# Patient Record
Sex: Female | Born: 1950 | Race: White | Hispanic: No | Marital: Married | Smoking: Former smoker
Health system: Southern US, Community
[De-identification: ages and names within clinical notes are randomized; demographics above are authoritative.]

## PROBLEM LIST (undated history)

## (undated) DIAGNOSIS — J309 Allergic rhinitis, unspecified: Secondary | ICD-10-CM

## (undated) HISTORY — DX: Allergic rhinitis, unspecified: J30.9

## (undated) HISTORY — PX: TUBAL LIGATION: SHX77

---

## 2015-04-28 ENCOUNTER — Encounter: Payer: Self-pay | Admitting: Pulmonary Disease

## 2015-04-28 ENCOUNTER — Other Ambulatory Visit (INDEPENDENT_AMBULATORY_CARE_PROVIDER_SITE_OTHER): Payer: Medicaid Other

## 2015-04-28 ENCOUNTER — Ambulatory Visit (INDEPENDENT_AMBULATORY_CARE_PROVIDER_SITE_OTHER): Payer: Medicaid Other | Admitting: Pulmonary Disease

## 2015-04-28 VITALS — BP 104/70 | HR 89 | Temp 98.0°F | Ht 63.0 in | Wt 150.0 lb

## 2015-04-28 DIAGNOSIS — J9611 Chronic respiratory failure with hypoxia: Secondary | ICD-10-CM

## 2015-04-28 DIAGNOSIS — J8410 Pulmonary fibrosis, unspecified: Secondary | ICD-10-CM | POA: Diagnosis not present

## 2015-04-28 DIAGNOSIS — R0600 Dyspnea, unspecified: Secondary | ICD-10-CM | POA: Diagnosis not present

## 2015-04-28 LAB — CBC WITH DIFFERENTIAL/PLATELET
BASOS ABS: 0 10*3/uL (ref 0.0–0.1)
Basophils Relative: 0.3 % (ref 0.0–3.0)
Eosinophils Absolute: 0.2 10*3/uL (ref 0.0–0.7)
Eosinophils Relative: 2.3 % (ref 0.0–5.0)
HEMATOCRIT: 39.2 % (ref 36.0–46.0)
Hemoglobin: 13.5 g/dL (ref 12.0–15.0)
LYMPHS ABS: 1.4 10*3/uL (ref 0.7–4.0)
LYMPHS PCT: 20.2 % (ref 12.0–46.0)
MCHC: 34.5 g/dL (ref 30.0–36.0)
MCV: 94.9 fl (ref 78.0–100.0)
Monocytes Absolute: 0.5 10*3/uL (ref 0.1–1.0)
Monocytes Relative: 7.1 % (ref 3.0–12.0)
Neutro Abs: 4.9 10*3/uL (ref 1.4–7.7)
Neutrophils Relative %: 70.1 % (ref 43.0–77.0)
PLATELETS: 271 10*3/uL (ref 150.0–400.0)
RBC: 4.13 Mil/uL (ref 3.87–5.11)
RDW: 13.5 % (ref 11.5–15.5)
WBC: 6.9 10*3/uL (ref 4.0–10.5)

## 2015-04-28 LAB — BASIC METABOLIC PANEL
BUN: 8 mg/dL (ref 6–23)
CO2: 32 mEq/L (ref 19–32)
Calcium: 10.2 mg/dL (ref 8.4–10.5)
Chloride: 101 mEq/L (ref 96–112)
Creatinine, Ser: 0.81 mg/dL (ref 0.40–1.20)
GFR: 76 mL/min (ref >60.00)
Glucose, Bld: 93 mg/dL (ref 70–99)
POTASSIUM: 3.8 meq/L (ref 3.5–5.1)
Sodium: 137 mEq/L (ref 135–145)

## 2015-04-28 LAB — HEPATIC FUNCTION PANEL
ALT: 10 U/L (ref 0–35)
AST: 16 U/L (ref 0–37)
Albumin: 3.7 g/dL (ref 3.5–5.2)
Alkaline Phosphatase: 96 U/L (ref 39–117)
Bilirubin, Direct: 0.1 mg/dL (ref 0.0–0.3)
Total Bilirubin: 0.5 mg/dL (ref 0.2–1.2)
Total Protein: 7.7 g/dL (ref 6.0–8.3)

## 2015-04-28 LAB — TSH: TSH: 1.25 u[IU]/mL (ref 0.35–4.50)

## 2015-04-28 LAB — SEDIMENTATION RATE: Sed Rate: 46 mm/hr — ABNORMAL HIGH (ref 0–22)

## 2015-04-28 MED ORDER — PREDNISONE 20 MG PO TABS: 20.0000 mg | ORAL_TABLET | Freq: Every day | ORAL | Status: DC

## 2015-04-28 NOTE — Progress Notes (Addendum)
Subjective:     Patient ID: Jane Turner, female   DOB: 10/08/1951, 64 y.o.   MRN: [id number]  HPI 64 y/o female referred by Dr. Mora ApplMcLeod in [place], [place] for a pulmonary evaluation>> DrMcLeod saw [name] for the 1st time 03/11/15 w/ hx dry cough & "chronic pneumonia" per her prev physician (Dr. 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 She was [facility] an [facility] inhaler & thinks maybe that helped a little but she is off this now; used VENTOLIN-HFA w/ some improvement... DrMcLeod started her on OXYGEN at 2L/min flow based on an O2sat of 84% in his office...       She is an ex-smoker having started at age9 (around [id number]) and quit in 1990; 30 yrs of smoking mostly 1ppd but up to 2ppd when anxious; est ~40 pack-yr smoking hx...       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      FamHx includes heart dis, arthritis, but no lung diseases in family...      We have called for old records to review...        EXAM reveals Afeb, VSS, O2sat=92% on 2L/min at rest;  HEENT- neg, mallampati3;  Chest- short shallow breaths, fine velcro rales 1/2 way up w/o wheezing or consolidation;  Abd- soft, nontender;  Ext- w/o c/c/e...  [facility]  [facility]> diffuse increase in interstitial markings, normal heart size, no effusion... Note- last prev CXR was 11/2003 & it was reported clear...   [facility] 01/09/15> diffuse interstitial infiltrates in both lungs w/ periph & basilar predominance, mild bronchiectasis in RLL, 8mm noncalcif nodule in LUL, no signif adenopathy, smHH, fatty replacement of pancreas, sm cyst right kidney...  Spirometry 03/11/15 in [place]> FVC=1.25 (40%), FEV1=1.15 (48%), %1sec=93, mid-flows were recorded superphysiologic at 142% predicted; the flow-vol loop indicates poor cooperation w/ the testing procedure  Ambulatory oxygen saturation test> O2sat on 2L/min [place] at rest=97% w/ pulse=84/min;  She walked only 1 lap in our office w/ lowest O2sat=86% on her 2L oxygen & pulse was 99/min...  LABS 5/16 w/ collagen-vasc screen> Chems- wnl;  CBC- wnl;  TSH=1.25;  ACE=35;  Sed=46;  RF=neg;  p-ANCA=neg... ANA=neg;    ADDENDUM> Hi-res CTChest> done 05/06/15> +diffuse ILD w/ patchy areas of ground-glass attenuation, subpleural & peribronchovasc reticulation, and thckening of the interstitium; some assoc traction bronchiectasis (esp in LLs) and peripheral bronchiolectasis w/o frank honeycombing; no mediastinal adenopathy, norm heart size, no peric effus, smallHH, fatty atrophy of pancreas; Radiology favors fibrotic phase NSIP but the craniocaudal gradient favors UIP...   ADDENDUM> FullPFTs> pending  IMP/PLAN>> This is going to be difficult for her to work out, traveling for the  FullPFT & back for the Hi-res CTChest; ideally she would need Bronch w/ Lavage and TBBx but this is going to be difficult for her;  She has signif interstitial lung dis but the etiology/ Dx is ???at this point;  We discussed all this quite frankly & I think the best option for her might be to start PREDNSONE ~20mg /d while we attempt to gather more information, see if she has a steroid responsive pneumonitis/ ILD. (note> I spent 60 min  face to face time w/ her Hx, exam, and discussion of her diease process, and options for evaluation & treatment)    Past Medical History  Diagnosis Date  . Allergic rhinitis    ? Other PMHx >>          Ulcers >. She is taking OTC Ranitadine prn         "they are checking my left breast"         c/o back pain          Anxiety (stress w/ husb)         ?Vit B12 defic >. She is taking an OTC B12 supplement         ?Vit D defic >> she is taking an OTC VitD supplement    Past Surgical History  Procedure Laterality Date  . Tubal ligation      Patient's Medications  New Prescriptions   No medications on file  Previous Medications   ALBUTEROL (PROVENTIL HFA;VENTOLIN HFA) 108 (90 BASE) MCG/ACT INHALER    Inhale 2 puffs into the lungs every 6 (six) hours as needed for wheezing or shortness of breath.   RANITIDINE (ZANTAC) 150 MG TABLET    Take 150 mg by mouth 2 (two) times daily.  Modified Medications   No medications on file  Discontinued Medications   No medications on file   Allergies  Allergen Reactions  . Aspirin Nausea And Vomiting  . Bee Venom Swelling  . Aleve [Naproxen Sodium] Palpitations  . Azithromycin Rash  . Itraconazole Rash  . Levaquin [Levofloxacin In D5w] Rash    Family History  Problem Relation Age of Onset  . Heart disease Mother   . Heart disease Father   . Lung cancer Maternal Grandmother   . Stomach cancer Maternal Grandmother   Father died in his 13s with MI, hx arthritis, and "2-3 different kinds of cancer"... Mother died at age 103 w/ a massive MI... 4 Sibs> 2Bro- one died w/ leukemia, one still alive w/ heart disease; 2 half-sis estranged but A/W...  History   Social History  . Marital Status: Married    Spouse Name: N/A  . Number of Children: N/A  . Years of Education: N/A   Occupational History  . Not on file.   Social History Main Topics  . Smoking status: Former Smoker -- 1.00 packs/day for 30 years    Types: Cigarettes    Quit date:  01/26/1989  . Smokeless tobacco: Not on file  . Alcohol Use: Not on file  . Drug Use: Not on file  . Sexual Activity: Not on file   Other Topics Concern  . Not on file   Social History Narrative  . No narrative on file    Current Medications, Allergies, Past Medical History, Past Surgical History, Family History, and Social History were reviewed in [employer] record.   Review of Systems         All symptoms NEG except where BOLDED >>  Constitutional:  Denies F/C/S, anorexia, unexpected weight change. HEENT:  No HA, visual changes, earache, nasal symptoms, sore throat, hoarseness. Resp:  cough, sputum, hemoptysis; SOB, tightness, wheezing. Cardio:  CP, palpit, DOE, orthopnea, edema. GI:  Denies N/V/D/C or blood in stool; reflux, abd pain, distention, or gas. GU:  No dysuria, freq, urgency, hematuria, or flank pain. MS:  joint pain, swelling, tenderness, neck pain, back pain, etc. Neuro:  Headaches, tremors, seizures, dizziness, syncope, weakness, numbness, gait abn. Skin:  No suspicious lesions or skin rash. Heme:  No adenopathy, bruising, bleeding. Psyche: Denies confusion, sleep disturbance, hallucinations, anxiety, depression.   Objective:   Physical Exam    Vital Signs:  Reviewed... on O2 at 2L/min [place]...  General:  WD, WN, 64 y/o WF in NAD, chr ill appearing; alert & oriented; pleasant & cooperative... HEENT:  [place]/AT; Conjunctiva- pink, Sclera- nonicteric, EOM-wnl, PERRLA, EACs-clear, TMs-wnl; NOSE-clear; THROAT-clear & wnl. Neck:  Supple w/ fair ROM; no JVD; normal carotid impulses w/o bruits; no thyromegaly or nodules palpated; no lymphadenopathy. Chest:  Short shallow breaths, fine velcro rales 1/2 way up, no wheezing or consolidation... Heart:  Regular Rhythm; norm S1 & S2 without murmurs, rubs, or gallops detected. Abdomen:  Soft & nontender- no guarding or rebound; normal bowel sounds; no organomegaly or masses palpated. Ext:  mild arthritic  changes; no varicose veins, +venous insuffic, tr edema;  Pulses intact w/o bruits. Neuro:  CNs II-XII intact; motor testing normal; sensory testing normal; gait normal & balance OK. Derm:  No lesions noted; no rash etc. Lymph:  No cervical, supraclavicular, axillary, or inguinal adenopathy palpated.   Assessment:     IMP >>  Diffuse Interstitial Lung Disease ?etiology Chronic hypoxemic resp failure   Ex-smoker Restrictive lung physiology  PLAN >> This is going to be difficult for her to work out, traveling for the FullPFT & back for the Hi-res CTChest; ideally she would need Bronch w/ Lavage and TBBx but this is going to be difficult for her;  She has signif interstitial lung dis but the etiology/ Dx is ???at this point;  We discussed all this quite frankly & I think the best option for her might be to start PREDNSONE ~20mg /d while we attempt to gather more information, see if she has a steroid responsive pneumonitis/ ILD.     Plan:     Patient's Medications  New Prescriptions   PREDNISONE (DELTASONE) 20 MG TABLET    Take 1 tablet (20 mg total) by mouth daily.  Previous Medications   ALBUTEROL (PROVENTIL HFA;VENTOLIN HFA) 108 (90 BASE) MCG/ACT INHALER    Inhale 2 puffs into the lungs every 6 (six) hours as needed for wheezing or shortness of breath.   RANITIDINE (ZANTAC) 150 MG TABLET    Take 150 mg by mouth 2 (two) times daily.  Modified Medications   No medications on file  Discontinued Medications   No medications on file

## 2015-04-28 NOTE — Patient Instructions (Signed)
Jane Turner, it was nice meeting you today...  I am concerned about your lung health>    You appear to have significant scar tissue (fibrosis) on your lungs...    We need further evaluation AND consideration of treatment for this condition...  Today we did some blood work...    We will arrange for Full PFTs to be done (complete breathing tests)...    You also need a special CT scan called a high resolution CT Chest- we will get this arranged for you...  In the interim>    Continue the Oxygen at 2L/min flow rate...    Start the new PREDNISONE 20mg  tab- take one tab daily each AM...    You may use the OTC cough syrup- DELSYM 2 tsp twice daily as needed...    You may continue the PROAIR inhaler- 1-2 sprays every 4-6h as needed...  Let's plan a follow up visit in 1 month.Marland Kitchen..Marland Kitchen

## 2015-04-29 LAB — PAN-ANCA
Atypical p-ANCA Screen: NEGATIVE
c-ANCA Screen: NEGATIVE
p-ANCA Screen: NEGATIVE

## 2015-04-29 LAB — RHEUMATOID FACTOR

## 2015-04-29 LAB — ANA: ANA: NEGATIVE

## 2015-04-29 LAB — ANGIOTENSIN CONVERTING ENZYME: ANGIOTENSIN-CONVERTING ENZYME: 35 U/L (ref 8–52)

## 2015-05-06 ENCOUNTER — Ambulatory Visit (INDEPENDENT_AMBULATORY_CARE_PROVIDER_SITE_OTHER)
Admission: RE | Admit: 2015-05-06 | Discharge: 2015-05-06 | Disposition: A | Discharge status: Home / Self Care | Payer: Medicaid Other | Source: Ambulatory Visit | Attending: Pulmonary Disease | Admitting: Pulmonary Disease

## 2015-05-06 DIAGNOSIS — J9611 Chronic respiratory failure with hypoxia: Secondary | ICD-10-CM | POA: Diagnosis not present

## 2015-05-06 DIAGNOSIS — J8410 Pulmonary fibrosis, unspecified: Secondary | ICD-10-CM

## 2015-05-06 DIAGNOSIS — R0600 Dyspnea, unspecified: Secondary | ICD-10-CM

## 2015-06-01 ENCOUNTER — Ambulatory Visit (INDEPENDENT_AMBULATORY_CARE_PROVIDER_SITE_OTHER)
Admission: RE | Admit: 2015-06-01 | Discharge: 2015-06-01 | Disposition: A | Discharge status: Home / Self Care | Payer: Medicaid Other | Source: Ambulatory Visit | Attending: Pulmonary Disease | Admitting: Pulmonary Disease

## 2015-06-01 ENCOUNTER — Telehealth: Payer: Self-pay | Admitting: Pulmonary Disease

## 2015-06-01 ENCOUNTER — Ambulatory Visit (INDEPENDENT_AMBULATORY_CARE_PROVIDER_SITE_OTHER): Payer: Medicaid Other | Admitting: Pulmonary Disease

## 2015-06-01 ENCOUNTER — Encounter (INDEPENDENT_AMBULATORY_CARE_PROVIDER_SITE_OTHER): Payer: Medicaid Other

## 2015-06-01 ENCOUNTER — Encounter: Payer: Self-pay | Admitting: Pulmonary Disease

## 2015-06-01 VITALS — BP 104/60 | HR 87 | Temp 97.2°F | Ht 63.0 in | Wt 152.0 lb

## 2015-06-01 DIAGNOSIS — J8410 Pulmonary fibrosis, unspecified: Secondary | ICD-10-CM

## 2015-06-01 DIAGNOSIS — J9611 Chronic respiratory failure with hypoxia: Secondary | ICD-10-CM

## 2015-06-01 DIAGNOSIS — R0600 Dyspnea, unspecified: Secondary | ICD-10-CM

## 2015-06-01 LAB — PULMONARY FUNCTION TEST
FEF 25-75 PRE: 0.86 L/s
FEF2575-%PRED-PRE: 38 %
FEV1-%PRED-PRE: 35 %
FEV1-PRE: 0.88 L
FEV1FVC-%Pred-Pre: 114 %
FEV6-%Pred-Pre: 32 %
FEV6-PRE: 1 L
FEV6FVC-%Pred-Pre: 104 %
FVC-%PRED-PRE: 30 %
FVC-Pre: 1 L
PRE FEV1/FVC RATIO: 88 %
PRE FEV6/FVC RATIO: 100 %

## 2015-06-01 MED ORDER — PROMETHAZINE-CODEINE 6.25-10 MG/5ML PO SYRP: 5.0000 mL | ORAL_SOLUTION | Freq: Four times a day (QID) | ORAL | Status: DC | PRN

## 2015-06-01 MED ORDER — PREDNISONE 20 MG PO TABS: 20.0000 mg | ORAL_TABLET | Freq: Every day | ORAL | Status: DC

## 2015-06-01 NOTE — Progress Notes (Signed)
Subjective:     Patient ID: Jane Turner, female   DOB: [birth year], 64 y.o.   MRN: [id number]  HPI 64 y/o female referred by Dr. Mora Appl in [place], [place] for a pulmonary evaluation>> DrMcLeod saw Jane Turner for the 1st time 03/11/15 w/ hx dry cough & "chronic pneumonia" per her prev physician (Dr. 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 She was placed on an [facility] inhaler & thinks maybe that helped a little but she is off this now; used VENTOLIN-HFA w/ some improvement... DrMcLeod started her on OXYGEN at 2L/min flow based on an O2sat of 84% in his office...       She is an ex-smoker having started at age9 (around 62) and quit in 1990; 30 yrs of smoking mostly 1ppd but up to 2ppd when anxious; est ~40 pack-yr smoking hx...       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      FamHx includes heart dis, arthritis, but no lung diseases in family...      We have called for old records to review...  EXAM reveals Afeb, VSS, O2sat=92% on 2L/min at rest;  HEENT- neg, mallampati3;  Chest- short shallow breaths, fine velcro rales 1/2 way up w/o wheezing or consolidation;  Abd- soft, nontender;  Ext- w/o c/c/e...  CXR 12/31/14  at [facility]> diffuse increase in interstitial markings, normal heart size, no effusion... Note- last prev CXR was 11/2003 & it was reported clear...   CTChest at [facility] 01/09/15> diffuse interstitial infiltrates in both lungs w/ periph & basilar predominance, mild bronchiectasis in RLL, 8mm noncalcif nodule in LUL, no signif adenopathy, smHH, fatty replacement of pancreas, sm cyst right kidney...  Spirometry 03/11/15 in [place]> FVC=1.25 (40%), FEV1=1.15 (48%), %1sec=93, mid-flows were recorded superphysiologic at 142% predicted; the flow-vol loop indicates poor cooperation w/ the testing procedure  Ambulatory oxygen saturation test 04/28/15> O2sat on 2L/min [place] at rest=97% w/ pulse=84/min;  She walked only 1 lap in our office w/ lowest O2sat=86% on her 2L oxygen & pulse was 99/min...  LABS 5/16 w/ collagen-vasc screen> Chems- wnl;  CBC- wnl;  TSH=1.25;  ACE=35;  Sed=46;  RF=neg;  p-ANCA=neg... ANA=neg;    ADDENDUM> Hi-res CTChest> done 05/06/15> +diffuse ILD w/ patchy areas of ground-glass attenuation, subpleural & peribronchovasc reticulation, and thckening of the interstitium; some assoc traction bronchiectasis (esp in LLs) and peripheral bronchiolectasis w/o frank honeycombing; no mediastinal adenopathy, norm heart size, no peric effus, smallHH, fatty atrophy of pancreas; Radiology favors fibrotic phase NSIP but the craniocaudal gradient favors UIP...   ADDENDUM> FullPFTs> sched 06/01/15 but pt unable to perform. IMP/PLAN>> This is going to be difficult for her to work out, traveling for the  FullPFT & back for the Hi-res CTChest; ideally she would need Bronch w/ Lavage and TBBx but this is going to be difficult for her;  She has signif interstitial lung dis but the etiology/ Dx is ???at this point;  We discussed all this quite frankly & I think the best option for her might be to start PREDNSONE ~20mg /d while we attempt to gather more information, see if she has a steroid responsive pneumonitis/  ILD.  ~  June 01, 2015:  35mo ROV & Jane Turner has been on the Pred20mg /d for the last month- no real change in her dyspnea, dry cough, "chest tightness"... She is here alone today & w/o her oxygen...     ILD ?etiology, restrictive physiology, bronchiectasis, chronic hypoxemic resp failure> it appears far advanced & likely in the fibrotic phase; she needs further evaluation & lung biopsy, I have suggested [facility] but she is not sure that she wants that aggressive approach & may prefer conservative approach to eval & treatment => port O2 concentrator, oral Pred, cough control, and comfort measures (she will let us know her decision); ECOG performance status=1 at present...       We reviewed prob list, meds, xrays and labs>   CXR today revealed normal heart size, coarse interstitial markings R>L c/w pulm fibrosis, no complicating features...  PFT today was aborted- she was unable to perform the PFT> avail data showed FVC=1.00 (30%), FEV1=0.88 (35%), %1sec=88, and mid-flows reduced at 38% predicted...   IMP/PLAN>>  We spent quite a bit of time discussing her dilemma- severe ILD, likely a fibrotic phase and unlikely to be responsive to treatment; We laid out 2 options> A) a more aggressive approach w/ referral to [facility] for additional testing, lung biopsy, and treatment options; or B) a more conservative approach w/ O2, Pred trial, cough syrup, comfort measures, and local follow up; she is quite conflicted as she does not have a good support system- she describes Husb as "child-like" and no help to her, 2 sons- one handicapped & one no help at all, one sis is controlling & she doesn't know where to turn, she will consider her options and let me know; in the meanwhile- we will ask [facility]- her Resp/DME for a portable concentrator (2L/min at rest & up to 4L/min w/ exercise); continue Pred20mg /d for now; add Phen Expect w/ codeine for cough; plan ROV 6wks w/ labs...    Past Medical History  Diagnosis Date  .  Allergic rhinitis    ? Other PMHx >>          Ulcers >. She is taking OTC Ranitadine prn         "they are checking my left breast"         c/o back pain          Anxiety (stress w/ husb)         ?Vit B12 defic >. She is taking an OTC B12 supplement         ?Vit D defic >> she is taking an OTC VitD supplement    Past Surgical History  Procedure Laterality Date  . Tubal ligation      Outpatient Encounter Prescriptions as of 06/01/2015  Medication Sig  . albuterol (PROVENTIL HFA;VENTOLIN HFA) 108 (90 BASE) MCG/ACT inhaler Inhale 2 puffs into the lungs every 6 (six) hours as needed for wheezing or shortness of breath.  . predniSONE (DELTASONE) 20 MG tablet Take 1 tablet (20 mg total) by mouth daily.  .Marland Kitchen  ranitidine (ZANTAC) 150 MG tablet Take 150 mg by mouth 2 (two) times daily.  . [DISCONTINUED] predniSONE (DELTASONE) 20 MG tablet Take 1 tablet (20 mg total) by mouth daily.  . promethazine-codeine (PHENERGAN WITH CODEINE) 6.25-10 MG/5ML syrup Take 5 mLs by mouth every 6 (six) hours as needed for cough.   No facility-administered encounter medications on file as of 06/01/2015.    Allergies  Allergen Reactions  . Aspirin Nausea And Vomiting  . Bee Venom Swelling  . Aleve [Naproxen Sodium] Palpitations  . Azithromycin Rash  . Itraconazole Rash  . Levaquin [Levofloxacin In D5w] Rash    Family History  Problem Relation Age of Onset  . Heart disease Mother   . Heart disease Father   . Lung cancer Maternal Grandmother   . Stomach cancer Maternal Grandmother   Father died in his 29s with MI, hx arthritis, and "2-3 different kinds of cancer"... Mother died at age 58 w/ a massive MI... 4 Sibs> 2Bro- one died w/ leukemia, one still alive w/ heart disease; 2 half-sis estranged but A/W...  History   Social History  . Marital Status: Married    Spouse Name: N/A  . Number of Children: N/A  . Years of Education: N/A   Occupational History  . Not on file.   Social History Main Topics    . Smoking status: Former Smoker -- 1.00 packs/day for 30 years    Types: Cigarettes    Quit date: 01/26/1989  . Smokeless tobacco: Not on file  . Alcohol Use: Not on file  . Drug Use: Not on file  . Sexual Activity: Not on file   Other Topics Concern  . Not on file   Social History Narrative    Current Medications, Allergies, Past Medical History, Past Surgical History, Family History, and Social History were reviewed in [employer] record.   Review of Systems         All symptoms NEG except where BOLDED >>  Constitutional:  Denies F/C/S, anorexia, unexpected weight change. HEENT:  No HA, visual changes, earache, nasal symptoms, sore throat, hoarseness. Resp:  cough, sputum, hemoptysis; SOB, tightness, wheezing. Cardio:  CP, palpit, DOE, orthopnea, edema. GI:  Denies N/V/D/C or blood in stool; reflux, abd pain, distention, or gas. GU:  No dysuria, freq, urgency, hematuria, or flank pain. MS:  joint pain, swelling, tenderness, neck pain, back pain, etc. Neuro:  Headaches, tremors, seizures, dizziness, syncope, weakness, numbness, gait abn. Skin:  No suspicious lesions or skin rash. Heme:  No adenopathy, bruising, bleeding. Psyche: Denies confusion, sleep disturbance, hallucinations, anxiety, depression.   Objective:   Physical Exam    Vital Signs:  Reviewed... on O2 at 2L/min [place]...  General:  WD, WN, Jane Turner in NAD, chr ill appearing; alert & oriented; pleasant & cooperative; talking in full sentences... HEENT:  [place]/AT; Conjunctiva- pink, Sclera- nonicteric, EOM-wnl, PERRLA, EACs-clear, TMs-wnl; NOSE-clear; THROAT-clear & wnl. Neck:  Supple w/ fair ROM; no JVD; normal carotid impulses w/o bruits; no thyromegaly or nodules palpated; no lymphadenopathy. Chest:   fine velcro rales 1/2 way up, no wheezing or consolidation heard... Heart:  Regular Rhythm; norm S1 & S2 without murmurs, rubs, or gallops detected. Abdomen:  Soft & nontender- no guarding or  rebound; normal bowel sounds; no organomegaly or masses palpated. Ext:  mild arthritic changes; no varicose veins, +venous insuffic, tr edema;  Pulses intact w/o bruits. Neuro:  No focal neuro deficits, gait normal & balance OK. Derm:  No lesions noted;  no rash etc. Lymph:  No cervical, supraclavicular, axillary, or inguinal adenopathy palpated.   Assessment:     IMP >>  Diffuse Interstitial Lung Disease ?etiology Chronic hypoxemic resp failure   Ex-smoker Restrictive lung physiology  PLAN >>  We spent quite a bit of time discussing her dilemma- severe ILD, likely a fibrotic phase and unlikely to be responsive to treatment; We laid out 2 options> A) a more aggressive approach w/ referral to [facility] for additional testing, lung biopsy, and treatment options; or B) a more conservative approach w/ O2, Pred trial, cough syrup, comfort measures, and local follow up; she is quite conflicted as she does not have a good support system- she describes Husb as "child-like" and no help to her, 2 sons- one handicapped & one no help at all, one sis is controlling & she doesn't know where to turn, she will consider her options and let me know; in the meanwhile- we will ask [facility]- her Resp/DME for a portable concentrator (2L/min at rest & up to 4L/min w/ exercise); continue Pred20mg /d for now; add Phen Expect w/ codeine for cough; plan ROV 6wks w/ labs.     Plan:     Patient's Medications  New Prescriptions   PROMETHAZINE-CODEINE (PHENERGAN WITH CODEINE) 6.25-10 MG/5ML SYRUP    Take 5 mLs by mouth every 6 (six) hours as needed for cough.  Previous Medications   ALBUTEROL (PROVENTIL HFA;VENTOLIN HFA) 108 (90 BASE) MCG/ACT INHALER    Inhale 2 puffs into the lungs every 6 (six) hours as needed for wheezing or shortness of breath.   RANITIDINE (ZANTAC) 150 MG TABLET    Take 150 mg by mouth 2 (two) times daily.  Modified Medications   Modified Medication Previous Medication   PREDNISONE (DELTASONE) 20 MG TABLET  predniSONE (DELTASONE) 20 MG tablet      Take 1 tablet (20 mg total) by mouth daily.    Take 1 tablet (20 mg total) by mouth daily.  Discontinued Medications   No medications on file

## 2015-06-01 NOTE — Patient Instructions (Signed)
Today we updated your med list in our EPIC system...    Continue your current medications the same...  Today we reviewed your options regarding your pulmonary fibrosis>>    A) consider going to [facility] for further evaluation & aggressive treatment options regarding this lung condition.Marland Kitchen.Marland Kitchen.    B) consider a conservative approach w/ empiric Prednisone therapy, cough syrup, oxygen therapy, and comfort measures...  Today we wrote to continue the Prednisone 20mg  per day for now...  We wrote for a good cough syrup> Phen Expectorant w/ Cod> one tsp every 4-6H as needed for cough...  We will arrange for a good Portable Oxygen Concentrator to use at 2L/min when at rest, and crank it up to 4L/min when mobile & exercising...  Call for any questions or if I can be of service in any way...  Let's plan a follow up visit in 6weeks.Marland Kitchen..Marland Kitchen

## 2015-06-01 NOTE — Telephone Encounter (Signed)
LMTCB-Medicaid does not cover any cough syrup Rx's-I believe patient will need to get OTC cough syrup. Will need to check with SN though.

## 2015-06-02 ENCOUNTER — Telehealth: Payer: Self-pay | Admitting: Pulmonary Disease

## 2015-06-02 NOTE — Telephone Encounter (Signed)
Will forward to [place] to f/u on  Thanks

## 2015-06-02 NOTE — Telephone Encounter (Signed)
Pt calling again this morning a/b insurance not covering cough med, informed her that we got msg on yesterday and that we will check with SN when he gets in this morning.Caren GriffinsStanley A Dalton

## 2015-06-02 NOTE — Telephone Encounter (Signed)
ATC pt x 3. Line busy. WCB.  

## 2015-06-03 NOTE — Telephone Encounter (Signed)
atc pt, na.   Dr. Kriste BasqueNadel please advise on recs for pt's cough.  Thanks!

## 2015-06-05 MED ORDER — ACETAMINOPHEN-CODEINE 120-12 MG/5ML PO SUSP: 5.0000 mL | ORAL | Status: DC | PRN

## 2015-06-05 NOTE — Telephone Encounter (Signed)
Rachel - has this been taken care of? Please advise. 

## 2015-06-05 NOTE — Telephone Encounter (Signed)
Fleet Contras - has this been taken care of yet?  Please advise.

## 2015-06-05 NOTE — Telephone Encounter (Signed)
Form signed by SN and faxed back. Form placed in SN scan folder to go downstairs. Nothing further is needed.

## 2015-06-05 NOTE — Telephone Encounter (Signed)
Spoke with pt's pharmacist about covered cough medication. Only thing covered by her insurance is tylenol with codeine elixir. SN informed of this and rx script accordingly. Pt called and notified of the change. Pt requested that rx be mailed to her. Pt informed that mail has already ran for the office and that it will not go out until Monday. Pt is ok with this . Nothing further is needed at this time.

## 2015-07-13 ENCOUNTER — Ambulatory Visit: Payer: Medicaid Other | Admitting: Pulmonary Disease

## 2015-08-07 ENCOUNTER — Ambulatory Visit: Payer: Medicaid Other | Admitting: Pulmonary Disease

## 2015-08-21 ENCOUNTER — Encounter: Payer: Self-pay | Admitting: Pulmonary Disease

## 2015-08-21 ENCOUNTER — Ambulatory Visit (INDEPENDENT_AMBULATORY_CARE_PROVIDER_SITE_OTHER): Payer: Medicaid Other | Admitting: Pulmonary Disease

## 2015-08-21 VITALS — BP 104/70 | HR 91 | Temp 98.0°F | Wt 154.0 lb

## 2015-08-21 DIAGNOSIS — J8410 Pulmonary fibrosis, unspecified: Secondary | ICD-10-CM | POA: Diagnosis not present

## 2015-08-21 DIAGNOSIS — F411 Generalized anxiety disorder: Secondary | ICD-10-CM | POA: Insufficient documentation

## 2015-08-21 DIAGNOSIS — J9611 Chronic respiratory failure with hypoxia: Secondary | ICD-10-CM

## 2015-08-21 MED ORDER — ACETAMINOPHEN-CODEINE 120-12 MG/5ML PO SUSP: 5.0000 mL | ORAL | Status: DC | PRN

## 2015-08-21 MED ORDER — ALPRAZOLAM 0.5 MG PO TABS: ORAL_TABLET | ORAL | Status: DC

## 2015-08-21 MED ORDER — PREDNISONE 20 MG PO TABS: 20.0000 mg | ORAL_TABLET | Freq: Every day | ORAL | Status: DC

## 2015-08-21 NOTE — Progress Notes (Signed)
Subjective:     Patient ID: Jane Turner, female   DOB: [birth year], 64 y.o.   MRN: [id number]  HPI 64 y/o female referred by Dr. Mora Appl in [place], [place] for a pulmonary evaluation>> DrMcLeod saw Jane Turner for the 1st time 03/11/15 w/ hx dry cough & "chronic pneumonia" per her prev physician (Dr. 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 She was placed on an [facility] inhaler & thinks maybe that helped a little but she is off this now; used VENTOLIN-HFA w/ some improvement... DrMcLeod started her on OXYGEN at 2L/min flow based on an O2sat of 84% in his office...       She is an ex-smoker having started at age9 (around 62) and quit in 1990; 30 yrs of smoking mostly 1ppd but up to 2ppd when anxious; est ~40 pack-yr smoking hx...       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      FamHx includes heart dis, arthritis, but no lung diseases in family...      We have called for old records to review...  EXAM reveals Afeb, VSS, O2sat=92% on 2L/min at rest;  HEENT- neg, mallampati3;  Chest- short shallow breaths, fine velcro rales 1/2 way up w/o wheezing or consolidation;  Abd- soft, nontender;  Ext- w/o c/c/e...  CXR 12/31/14  at [facility]> diffuse increase in interstitial markings, normal heart size, no effusion... Note- last prev CXR was 11/2003 & it was reported clear...   CTChest at [facility] 01/09/15> diffuse interstitial infiltrates in both lungs w/ periph & basilar predominance, mild bronchiectasis in RLL, 8mm noncalcif nodule in LUL, no signif adenopathy, smHH, fatty replacement of pancreas, sm cyst right kidney...  Spirometry 03/11/15 in [place]> FVC=1.25 (40%), FEV1=1.15 (48%), %1sec=93, mid-flows were recorded superphysiologic at 142% predicted; the flow-vol loop indicates poor cooperation w/ the testing procedure  Ambulatory oxygen saturation test 04/28/15> O2sat on 2L/min [place] at rest=97% w/ pulse=84/min;  She walked only 1 lap in our office w/ lowest O2sat=86% on her 2L oxygen & pulse was 99/min...  LABS 5/16 w/ collagen-vasc screen> Chems- wnl;  CBC- wnl;  TSH=1.25;  ACE=35;  Sed=46;  RF=neg;  p-ANCA=neg... ANA=neg;    ADDENDUM> Hi-res CTChest> done 05/06/15> +diffuse ILD w/ patchy areas of ground-glass attenuation, subpleural & peribronchovasc reticulation, and thckening of the interstitium; some assoc traction bronchiectasis (esp in LLs) and peripheral bronchiolectasis w/o frank honeycombing; no mediastinal adenopathy, norm heart size, no peric effus, smallHH, fatty atrophy of pancreas; Radiology favors fibrotic phase NSIP but the craniocaudal gradient favors UIP...   ADDENDUM> FullPFTs> sched 06/01/15 but pt unable to perform. IMP/PLAN>> This is going to be difficult for her to work out, traveling for the  FullPFT & back for the Hi-res CTChest; ideally she would need Bronch w/ Lavage and TBBx but this is going to be difficult for her;  She has signif interstitial lung dis but the etiology/ Dx is ???at this point;  We discussed all this quite frankly & I think the best option for her might be to start PREDNSONE ~20mg /d while we attempt to gather more information, see if she has a steroid responsive pneumonitis/  ILD.  ~  June 01, 2015:  33mo ROV & Jane Turner has been on the Pred20mg /d for the last month- no real change in her dyspnea, dry cough, "chest tightness"... She is here alone today & w/o her oxygen...     ILD ?etiology, restrictive physiology, bronchiectasis, chronic hypoxemic resp failure> it appears far advanced & likely in the fibrotic phase; she needs further evaluation & lung biopsy, I have suggested referral to [facility] but she is not sure that she wants that aggressive approach & may prefer conservative approach to eval & treatment => port O2 concentrator, oral Pred, cough control, and comfort measures (she will let us know her decision); ECOG performance status=1 at present...       We reviewed prob list, meds, xrays and labs>   CXR today revealed normal heart size, coarse interstitial markings R>L c/w pulm fibrosis, no complicating features...  PFT today was aborted- she was unable to perform the PFT> avail data showed FVC=1.00 (30%), FEV1=0.88 (35%), %1sec=88, and mid-flows reduced at 38% predicted...                       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 and unlikely to be responsive to treatment; We laid out 2 options> A) a more aggressive approach w/ referral to [facility] for additional testing, lung biopsy, and treatment options; or B) a more conservative approach w/ O2, Pred trial, cough syrup, comfort measures, and local follow up; she is quite conflicted as she does not have a good support system- she describes Husb as "child-like" and no help to her, 2 sons- one handicapped & one no help at all, one sis is controlling & she doesn't know where to turn, she will consider her options and let me know; in the meanwhile- we will ask [facility]- her Resp/DME for a portable concentrator (2L/min at rest & up to 4L/min w/ exercise); continue Pred20mg /d for now; add Phen Expect w/  codeine for cough; plan ROV 6wks w/ labs...  ~  August 21, 2015:  2-39mo ROV & Jane Turner reports "up & down- everythings about the same"> she's been on Pred20 but ran out several wks ago 7 didn't have the $$ to refill it she says (on [place]); her breathing is about the same w/ good days and bad, recently noted incr cough as well but ran out of cough syrup too...     ILD ?etiology w/ bronchiectasis on Hi-res CT, restrictive physiology, chronic hypoxemic resp failure> We reviewed the previously discussed option re: more aggressive care thru referral to [facility] vs continued more conservative approach as we've been doing w/ Pred trial, oxygen, cough syrup, comfort measures> she clearly favors the latter option,  can't handle the transport issues/ logistics of [facility]/ "it's too much for me" and she has poor support system... EXAM reveals Afeb, VSS, O2sat=94% on 2L/min at rest;  HEENT- neg, mallampati3;  Chest- short shallow breaths, fine velcro rales 1/2 way up w/o wheezing or consolidation;  Abd- soft, nontender;  Ext- w/o c/c/e... IMP/PLAN>>  Jane Turner wants to continue on a more conservative treatment path, doesn't want lung biopsy or to consider Perfenidone treatment option;  She understands that her prognosis is poor (guarded at best);  For now she will continue PRED /d, her Home O2, refill cough syrup, and plan ROV in 56mo w/ CXR & LABS (consider repeat CTChest as well);  She is quite anxious w/ mult stressors at home & offered Alprazolam 0.5mg  tid for as needed use...    Past Medical History  Diagnosis Date  . Allergic rhinitis    ? Other PMHx >>          Ulcers >. She is taking OTC Ranitadine prn         "they are checking my left breast"         c/o back pain          Anxiety (stress w/ husb)         ?Vit B12 defic >. She is taking an OTC B12 supplement         ?Vit D defic >> she is taking an OTC VitD supplement    Past Surgical History  Procedure Laterality Date  . Tubal ligation      Outpatient  Encounter Prescriptions as of 08/21/2015  Medication Sig  . albuterol (PROVENTIL HFA;VENTOLIN HFA) 108 (90 BASE) MCG/ACT inhaler Inhale 2 puffs into the lungs every 6 (six) hours as needed for wheezing or shortness of breath.  . ranitidine (ZANTAC) 150 MG tablet Take 150 mg by mouth 2 (two) times daily.  Marland Kitchen acetaminophen-codeine 120-12 MG/5ML suspension Take 5 mLs by mouth every 4 (four) hours as needed for pain. (Patient not taking: Reported on 08/21/2015)  . predniSONE (DELTASONE) 20 MG tablet Take 1 tablet (20 mg total) by mouth daily. (Patient not taking: Reported on 08/21/2015)  . promethazine-codeine (PHENERGAN WITH CODEINE) 6.25-10 MG/5ML syrup Take 5 mLs by mouth every 6 (six) hours as needed for cough. (Patient not taking: Reported on 08/21/2015)   No facility-administered encounter medications on file as of 08/21/2015.    Allergies  Allergen Reactions  . Aspirin Nausea And Vomiting  . Bee Venom Swelling  . Aleve [Naproxen Sodium] Palpitations  . Azithromycin Rash  . Itraconazole Rash  . Levaquin [Levofloxacin In D5w] Rash    Current Medications, Allergies, Past Medical History, Past Surgical History, Family History, and Social History were reviewed in [employer] record.   Review of Systems         All symptoms NEG except where BOLDED >>  Constitutional:  Denies F/C/S, anorexia, unexpected weight change. HEENT:  No HA, visual changes, earache, nasal symptoms, sore throat, hoarseness. Resp:  cough, sputum, hemoptysis; SOB, tightness, wheezing. Cardio:  CP, palpit, DOE, orthopnea, edema. GI:  Denies N/V/D/C or blood in stool; reflux, abd pain, distention, or gas. GU:  No dysuria, freq, urgency, hematuria, or flank pain. MS:  joint pain, swelling, tenderness, neck pain, back pain, etc. Neuro:  Headaches, tremors, seizures, dizziness, syncope, weakness, numbness, gait abn. Skin:  No suspicious lesions or skin rash. Heme:  No adenopathy, bruising,  bleeding. Psyche: Denies confusion, sleep disturbance, hallucinations, anxiety, depression.   Objective:  Physical Exam    Vital Signs:  Reviewed... on O2 at 2L/min [place]...  General:  WD, WN, 64 y/o WF in NAD, chr ill appearing; alert & oriented; pleasant & cooperative; talking in full sentences... HEENT:  [place]/AT; Conjunctiva- pink, Sclera- nonicteric, EOM-wnl, PERRLA, EACs-clear, TMs-wnl; NOSE-clear; THROAT-clear & wnl. Neck:  Supple w/ fair ROM; no JVD; normal carotid impulses w/o bruits; no thyromegaly or nodules palpated; no lymphadenopathy. Chest:   fine velcro rales 1/2 way up, no wheezing or consolidation heard... Heart:  Regular Rhythm; norm S1 & S2 without murmurs, rubs, or gallops detected. Abdomen:  Soft & nontender- no guarding or rebound; normal bowel sounds; no organomegaly or masses palpated. Ext:  mild arthritic changes; no varicose veins, +venous insuffic, tr edema;  Pulses intact w/o bruits. Neuro:  No focal neuro deficits, gait normal & balance OK. Derm:  No lesions noted; no rash etc. Lymph:  No cervical, supraclavicular, axillary, or inguinal adenopathy palpated.   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 and unlikely to be responsive to treatment; We laid out 2 options> A) a more aggressive approach w/ referral to [facility] for additional testing, lung biopsy, and treatment options; or B) a more conservative approach w/ O2, Pred trial, cough syrup, comfort measures, and local follow up; she is quite conflicted as she does not have a good support system- she describes Husb as "child-like" and no help to her, 2 sons- one handicapped & one no help at all, one sis is controlling & she doesn't know where to turn, she will consider her options and let  me know; in the meanwhile- we will ask [facility]- her Resp/DME for a portable concentrator (2L/min at rest & up to 4L/min w/ exercise); continue Pred20mg /d for now; add Phen Expect w/ codeine for cough; plan ROV 6wks w/ labs. 8/16> Jane Turner wants to continue on a more conservative treatment path, doesn't want lung biopsy or to consider Perfenidone treatment option;  She understands that her prognosis is poor (guarded at best);  For now she will continue PRED 20mg /d, her Home O2, refill cough syrup, and plan ROV in 87mo w/ CXR & LABS (consider repeat CTChest as well);  She is quite anxious w/ mult stressors at home & offered Alprazolam 0.5mg  tid for as needed use.     Plan:       Patient's Medications  New Prescriptions   ALPRAZOLAM (XANAX) 0.5 MG TABLET    Take 1 tablet by mouth three times daily as directed by physician  Previous Medications   ALBUTEROL (PROVENTIL HFA;VENTOLIN HFA) 108 (90 BASE) MCG/ACT INHALER    Inhale 2 puffs into the lungs every 6 (six) hours as needed for wheezing or shortness of breath.   PROMETHAZINE-CODEINE (PHENERGAN WITH CODEINE) 6.25-10 MG/5ML SYRUP    Take 5 mLs by mouth every 6 (six) hours as needed for cough.   RANITIDINE (ZANTAC) 150 MG TABLET    Take 150 mg by mouth 2 (two) times daily.  Modified Medications   Modified Medication Previous Medication   ACETAMINOPHEN-CODEINE 120-12 MG/5ML SUSPENSION acetaminophen-codeine 120-12 MG/5ML suspension      Take 5 mLs by mouth every 4 (four) hours as needed for pain.  Take 5 mLs by mouth every 4 (four) hours as needed for pain.   PREDNISONE (DELTASONE) 20 MG TABLET predniSONE (DELTASONE) 20 MG tablet      Take 1 tablet (20 mg total) by mouth daily.    Take 1 tablet (20 mg total) by mouth daily.  Discontinued Medications   No medications on file

## 2015-08-21 NOTE — Patient Instructions (Signed)
Today we updated your med list in our EPIC system...    Continue your current medications the same...  We refilled your cough syrup and the PREDNISONE - continue one tab each AM...  We also wrote for ALPRAZOLAM 0.5mg  one tab up to 3 times daily as needed for nerves to help w/ your anxiety...  Let's plan a follow up visit in about 2 months & we will plan to recheck your CXR & blood work at that time.Marland KitchenMarland Kitchen

## 2015-10-21 ENCOUNTER — Encounter: Payer: Self-pay | Admitting: Pulmonary Disease

## 2015-10-21 ENCOUNTER — Ambulatory Visit (INDEPENDENT_AMBULATORY_CARE_PROVIDER_SITE_OTHER): Payer: Medicaid Other | Admitting: Pulmonary Disease

## 2015-10-21 VITALS — BP 104/70 | HR 86 | Temp 97.8°F | Wt 154.0 lb

## 2015-10-21 DIAGNOSIS — F411 Generalized anxiety disorder: Secondary | ICD-10-CM | POA: Diagnosis not present

## 2015-10-21 DIAGNOSIS — Z23 Encounter for immunization: Secondary | ICD-10-CM

## 2015-10-21 DIAGNOSIS — J9611 Chronic respiratory failure with hypoxia: Secondary | ICD-10-CM

## 2015-10-21 DIAGNOSIS — J8410 Pulmonary fibrosis, unspecified: Secondary | ICD-10-CM | POA: Diagnosis not present

## 2015-10-21 MED ORDER — ACETAMINOPHEN-CODEINE 120-12 MG/5ML PO SUSP: 5.0000 mL | ORAL | Status: AC | PRN

## 2015-10-21 MED ORDER — ALPRAZOLAM 0.5 MG PO TABS: ORAL_TABLET | ORAL | Status: DC

## 2015-10-21 NOTE — Patient Instructions (Signed)
Today we updated your med list in our EPIC system...     We decided to cut your PREDNISONE (20mg  tabs) dose slightly>    Take one tab one day and 1/2 tab the next on an alternating schedule every other day (1, 1/2, 1, 1/2, 1, 1/2, etc)...  We refilled your Tylenol w/ codeine cough syrup, and your Xanax nerve pill...  Today we checked a follow up CXR & blood work...    We will contact you w/ the results when available...   We also gave you the 2016 FLU vaccine today...  Call for any questions...  Let's plan a follow up visit in about 3 months, sooner if needed for problems.Marland Kitchen..Marland Kitchen

## 2016-01-21 ENCOUNTER — Encounter: Payer: Self-pay | Admitting: Pulmonary Disease

## 2016-01-21 ENCOUNTER — Ambulatory Visit (INDEPENDENT_AMBULATORY_CARE_PROVIDER_SITE_OTHER)
Admission: RE | Admit: 2016-01-21 | Discharge: 2016-01-21 | Disposition: A | Discharge status: Home / Self Care | Payer: Medicaid Other | Source: Ambulatory Visit | Attending: Pulmonary Disease | Admitting: Pulmonary Disease

## 2016-01-21 ENCOUNTER — Ambulatory Visit (INDEPENDENT_AMBULATORY_CARE_PROVIDER_SITE_OTHER): Payer: Medicaid Other | Admitting: Pulmonary Disease

## 2016-01-21 ENCOUNTER — Telehealth: Payer: Self-pay | Admitting: Internal Medicine

## 2016-01-21 VITALS — BP 128/80 | HR 93 | Temp 98.2°F | Ht 63.0 in | Wt 167.0 lb

## 2016-01-21 DIAGNOSIS — J8410 Pulmonary fibrosis, unspecified: Secondary | ICD-10-CM | POA: Diagnosis not present

## 2016-01-21 DIAGNOSIS — F411 Generalized anxiety disorder: Secondary | ICD-10-CM

## 2016-01-21 DIAGNOSIS — J9611 Chronic respiratory failure with hypoxia: Secondary | ICD-10-CM

## 2016-01-21 MED ORDER — PROMETHAZINE-CODEINE 6.25-10 MG/5ML PO SYRP: 5.0000 mL | ORAL_SOLUTION | Freq: Four times a day (QID) | ORAL | Status: DC | PRN

## 2016-01-21 MED ORDER — ALPRAZOLAM 0.5 MG PO TABS: ORAL_TABLET | ORAL | Status: DC

## 2016-01-21 NOTE — Patient Instructions (Signed)
Today we updated your med list in our EPIC system...    Continue your current medications the same...    We refilled your cough syrup and your alprazolam nerve pill (and wrote for additional refills when needed)...  Let's plan to keep the PREDNISONE  tabs at one tab one day, alternating w/ 1/2 tab the next day...  Today we did a follow up CXR...    We will contact you w/ the results when available...   Call for any questions...  Let's plan a follow up visit in 61mo, sooner if needed for problems.Marland KitchenMarland Kitchen

## 2016-01-21 NOTE — Telephone Encounter (Signed)
Can't afford meds but they all appear generic - can't identify which ones are the most unaffordable, asked her to discuss this directly with Dr Cicero Duck during office hours

## 2016-01-22 ENCOUNTER — Telehealth: Payer: Self-pay | Admitting: Pulmonary Disease

## 2016-01-22 NOTE — Telephone Encounter (Signed)
Patient calling back to speak with Dr. Kriste Basque Per MW, patient needs to discuss her medications with SN Patient concerned because she cannot afford her medications, although most of the medications she is taking are Generic (per MW)  SN - please contact patient at [phone]

## 2016-01-22 NOTE — Telephone Encounter (Signed)
I called pt to discuss her Med problem.  She has Medicaid & the prob is that she can't afford the $30 for non-medicaid covered drugs.  The only alternative that i can think of is to consider Hospice services/ coverage & she will think about it & let me know...  SMN

## 2016-01-22 NOTE — Telephone Encounter (Signed)
Duplicate call.  See TE 01/21/16

## 2016-01-22 NOTE — Telephone Encounter (Signed)
Patient needs to speak with Dr. Kriste Basque regarding medications. Per MW, patient needs to discuss with SN during office hours.  Patient can be reached at [phone]

## 2016-02-06 ENCOUNTER — Encounter: Payer: Self-pay | Admitting: Pulmonary Disease

## 2016-02-06 NOTE — Progress Notes (Signed)
Subjective:     Patient ID: Jane Turner, female   DOB: 1951-09-07, 65 y.o.   MRN: [id number]  HPI ~  Apr 28, 2015:  Initial pulmonary consult by SN>   65 y/o female referred by Dr. Mora Appl in [place], [place] for a pulmonary evaluation>> DrMcLeod saw [name] for the 1st time 03/11/15 w/ hx dry cough & "chronic pneumonia" per her prev physician (Dr. 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 She was placed on an [facility] inhaler & thinks maybe that helped a little but she is off this now; used VENTOLIN-HFA w/ some improvement... DrMcLeod started her on OXYGEN at 2L/min flow based on an O2sat of 84% in his office...       She is an ex-smoker having started at age9 (around 69) and quit in 1990; 30 yrs of smoking mostly 1ppd but up to 2ppd when anxious; est ~40 pack-yr smoking hx...       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      FamHx includes heart dis, arthritis, but no lung diseases in family...      We have called for old records to review...  EXAM reveals Afeb, VSS, O2sat=92% on 2L/min at rest;  HEENT- neg, mallampati3;  Chest- short shallow breaths, fine velcro rales 1/2 way up w/o wheezing or consolidation;   Abd- soft, nontender;  Ext- w/o c/c/e...  CXR 12/31/14 at [facility]> diffuse increase in interstitial markings, normal heart size, no effusion... Note- last prev CXR was 11/2003 & it was reported clear...   CTChest at [facility] 01/09/15> diffuse interstitial infiltrates in both lungs w/ periph & basilar predominance, mild bronchiectasis in RLL, 8mm noncalcif nodule in LUL, no signif adenopathy, smHH, fatty replacement of pancreas, sm cyst right kidney...  Spirometry 03/11/15 in [place]> FVC=1.25 (40%), FEV1=1.15 (48%), %1sec=93, mid-flows were recorded superphysiologic at 142% predicted; the flow-vol loop indicates poor cooperation w/ the testing procedure  Ambulatory oxygen saturation test 04/28/15> O2sat on 2L/min [place] at rest=97% w/ pulse=84/min;  She walked only 1 lap in our office w/ lowest O2sat=86% on her 2L oxygen & pulse was 99/min...  LABS 5/16 w/ collagen-vasc screen> Chems- wnl;  CBC- wnl;  TSH=1.25;  ACE=35;  Sed=46;  RF=neg;  p-ANCA=neg... ANA=neg;    ADDENDUM> Hi-res CTChest> done 05/06/15> +diffuse ILD w/ patchy areas of ground-glass attenuation, subpleural & peribronchovasc reticulation, and thckening of the interstitium; some assoc traction bronchiectasis (esp in LLs) and peripheral bronchiolectasis w/o frank honeycombing; no mediastinal adenopathy, norm heart size, no peric effus, smallHH, fatty atrophy of pancreas; Radiology favors fibrotic phase NSIP but the craniocaudal gradient favors UIP...   ADDENDUM> FullPFTs> sched 06/01/15 but pt unable to perform. IMP/PLAN>> This  is going to be difficult for her to work out, traveling for the FullPFT & back for the Hi-res CTChest; ideally she would need Bronch w/ Lavage and TBBx but this is going to be difficult for her;  She has signif interstitial lung dis but the etiology/ Dx is ???at this point;  We discussed all this quite frankly & I think the best option for her might be to start PREDNSONE ~20mg /d while we attempt to gather more  information, see if she has a steroid responsive pneumonitis/ ILD.  ~  June 01, 2015:  28mo ROV & Kimori has been on the Pred20mg /d for the last month- no real change in her dyspnea, dry cough, "chest tightness"... She is here alone today & w/o her oxygen...     ILD ?etiology, restrictive physiology, bronchiectasis, chronic hypoxemic resp failure> it appears far advanced & likely in the fibrotic phase; she needs further evaluation & lung biopsy, I have suggested referral to [facility] but she is not sure that she wants that aggressive approach & may prefer conservative approach to eval & treatment => port O2 concentrator, oral Pred, cough control, and comfort measures (she will let us know her decision); ECOG performance status=1 at present...       We reviewed prob list, meds, xrays and labs>   CXR today revealed normal heart size, coarse interstitial markings R>L c/w pulm fibrosis, no complicating features...  PFT today was aborted- she was unable to perform the PFT> avail data showed FVC=1.00 (30%), FEV1=0.88 (35%), %1sec=88, and mid-flows reduced at 38% predicted...                       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 and unlikely to be responsive to treatment; We laid out 2 options> A) a more aggressive approach w/ referral to [facility] for additional testing, lung biopsy, and treatment options; or B) a more conservative approach w/ O2, Pred trial, cough syrup, comfort measures, and local follow up; she is quite conflicted as she does not have a good support system- she describes Husb as "child-like" and no help to her, 2 sons- one handicapped & one no help at all, one sis is controlling & she doesn't know where to turn, she will consider her options and let me know; in the meanwhile- we will ask [facility]- her Resp/DME for a portable concentrator (2L/min at rest & up to 4L/min w/  exercise); continue Pred20mg /d for now; add Phen Expect w/ codeine for cough; plan ROV 6wks w/ labs...  ~  August 21, 2015:  2-328mo ROV & Jenissa reports "up & down- everythings about the same"> she's been on Pred20 but ran out several wks ago 7 didn't have the $$ to refill it she says (on [place]); her breathing is about the same w/ good days and bad, recently noted incr cough as well but ran out of cough syrup too...     ILD ?etiology w/ bronchiectasis on Hi-res CT, restrictive physiology, chronic hypoxemic resp failure> We reviewed the previously discussed option re: more aggressive care thru referral to [facility] vs continued more conservative approach as we've been doing w/  Pred trial, oxygen, cough syrup, comfort measures> she clearly favors the latter option, can't handle the transport issues/ logistics of [facility]/ "it's too much for me" and she has poor support system... EXAM reveals Afeb, VSS, O2sat=94% on 2L/min at rest;  HEENT- neg, mallampati3;  Chest- short shallow breaths, fine velcro rales 1/2 way up w/o wheezing or consolidation;  Abd- soft, nontender;  Ext- w/o c/c/e... IMP/PLAN>>  Chanya wants to continue on a more conservative treatment path, doesn't want lung biopsy or to consider Perfenidone treatment option;  She understands that her prognosis is poor (guarded at best);  For now she will continue PRED 20mg /d, her Home O2, refill cough syrup, and plan ROV in 36mo w/ CXR & LABS (consider repeat CTChest as well);  She is quite anxious w/ mult stressors at home & offered Alprazolam 0.5mg  tid for as needed use...  ~  October 21, 2015:  36mo ROV & Kaoru reports that she is generally stable, good days & bad, notes incr DOE w/ activituies/ exertion;  She ventilated about probems w/ her ex-husband, family issues w/ children, but notes that her current husb has been helping more as has the church community;  She remains on Home O2, Pred20/d, Tylenol w/ codeine cough syrup, Albut HFA prn, Zantac150Bid &  Xanax0.5mg Tid (ran out);  She has chosen a conservative, supportive and symptomatic approach to treatment, she has declines more aggressive evaluation- lung bx, consideration of antifibrotic therapy, med center referral for second opinion, etc- and she reiterates her decision for me today...  EXAM shows Afeb, VSS, O2sat=92% on 2L/min at rest;  HEENT- neg, mallampati3;  Chest- short shallow breaths, fine velcro rales 1/2 way up w/o wheezing or consolidation;  Abd- soft, nontender;  Ext- w/o c/c/e...  CXR 10/16> she did not go to the [facility] as requested for f/u CXR today...  LABS 10/16> she did not go to the lab for f/u blood work as requested today... IMP/PLAN>>  ILD ?etiology (NSIP vs UIP- see Hi-res CTChest), w/ restrictive physiology, chronic hypoxemic resp failure; she forgot to go to the basement for f/u CXR & Labs as requested today; we decided to try to taper the Pred to 20mg  alternating w/ 10mg  Qod; she received the 2016 Flu vaccine today.... rec ROV in 2-3 months, sooner if needed for worsening symptoms.    Past Medical History  Diagnosis Date  . Allergic rhinitis    ? Other PMHx >>          Ulcers >. She is taking OTC Ranitadine prn         "they are checking my left breast"         c/o back pain          Anxiety (stress w/ husb)         ?Vit B12 defic >. She is taking an OTC B12 supplement         ?Vit D defic >> she is taking an OTC VitD supplement    Past Surgical History  Procedure Laterality Date  . Tubal ligation      Outpatient Encounter Prescriptions as of 10/21/2015  Medication Sig  . albuterol (PROVENTIL HFA;VENTOLIN HFA) 108 (90 BASE) MCG/ACT inhaler Inhale 2 puffs into the lungs every 6 (six) hours as needed for wheezing or shortness of breath.  . predniSONE (DELTASONE) 20 MG tablet Take 1 tablet (20 mg total) by mouth daily. (Patient taking differently: Take 20 mg by mouth daily. Alternate between 20mg  and 10mg  every other day)  .  ranitidine (ZANTAC) 150 MG  tablet Take 150 mg by mouth 2 (two) times daily.  Marland Kitchen acetaminophen-codeine 120-12 MG/5ML suspension Take 5 mLs by mouth every 4 (four) hours as needed for pain. (Patient not taking: Reported on 01/21/2016)  . [DISCONTINUED] acetaminophen-codeine 120-12 MG/5ML suspension Take 5 mLs by mouth every 4 (four) hours as needed for pain. (Patient not taking: Reported on 10/21/2015)  . [DISCONTINUED] ALPRAZolam (XANAX) 0.5 MG tablet Take 1 tablet by mouth three times daily as directed by physician (Patient not taking: Reported on 10/21/2015)  . [DISCONTINUED] ALPRAZolam (XANAX) 0.5 MG tablet Take 1 tablet by mouth three times daily as directed by physician (Patient not taking: Reported on 01/21/2016)  . [DISCONTINUED] promethazine-codeine (PHENERGAN WITH CODEINE) 6.25-10 MG/5ML syrup Take 5 mLs by mouth every 6 (six) hours as needed for cough. (Patient not taking: Reported on 08/21/2015)   No facility-administered encounter medications on file as of 10/21/2015.    Allergies  Allergen Reactions  . Aspirin Nausea And Vomiting  . Bee Venom Swelling  . Aleve [Naproxen Sodium] Palpitations  . Azithromycin Rash  . Itraconazole Rash  . Levaquin [Levofloxacin In D5w] Rash    Current Medications, Allergies, Past Medical History, Past Surgical History, Family History, and Social History were reviewed in [employer] record.   Review of Systems         All symptoms NEG except where BOLDED >>  Constitutional:  Denies F/C/S, anorexia, unexpected weight change. HEENT:  No HA, visual changes, earache, nasal symptoms, sore throat, hoarseness. Resp:  cough, sputum, hemoptysis; SOB, tightness, wheezing. Cardio:  CP, palpit, DOE, orthopnea, edema. GI:  Denies N/V/D/C or blood in stool; reflux, abd pain, distention, or gas. GU:  No dysuria, freq, urgency, hematuria, or flank pain. MS:  joint pain, swelling, tenderness, neck pain, back pain, etc. Neuro:  Headaches, tremors, seizures, dizziness,  syncope, weakness, numbness, gait abn. Skin:  No suspicious lesions or skin rash. Heme:  No adenopathy, bruising, bleeding. Psyche: Denies confusion, sleep disturbance, hallucinations, anxiety, depression.   Objective:   Physical Exam    Vital Signs:  Reviewed... on O2 at 2L/min [place]...  General:  WD, WN, 65 y/o WF in NAD, chr ill appearing; alert & oriented; pleasant & cooperative; talking in full sentences... HEENT:  [place]/AT; Conjunctiva- pink, Sclera- nonicteric, EOM-wnl, PERRLA, EACs-clear, TMs-wnl; NOSE-clear; THROAT-clear & wnl. Neck:  Supple w/ fair ROM; no JVD; normal carotid impulses w/o bruits; no thyromegaly or nodules palpated; no lymphadenopathy. Chest:   fine velcro rales 1/2 way up, no wheezing or consolidation heard... Heart:  Regular Rhythm; norm S1 & S2 without murmurs, rubs, or gallops detected. Abdomen:  Soft & nontender- no guarding or rebound; normal bowel sounds; no organomegaly or masses palpated. Ext:  mild arthritic changes; no varicose veins, +venous insuffic, tr edema;  Pulses intact w/o bruits. Neuro:  No focal neuro deficits, gait normal & balance OK. Derm:  No lesions noted; no rash etc. Lymph:  No cervical, supraclavicular, axillary, or inguinal adenopathy palpated.   Assessment:      IMP >>     Diffuse Interstitial Lung Disease ?etiology=> NSIP vs UIP via Hi-res CTChest 04/2015...    Chronic hypoxemic resp failure     Restrictive lung physiology    Ex-smoker     Anxiety  PLAN >>  6/16> We spent quite a bit of time discussing her dilemma- severe ILD, likely a fibrotic phase and unlikely to be responsive to treatment; We laid out 2 options> A) a more  aggressive approach w/ referral to [facility] for additional testing, lung biopsy, and treatment options; or B) a more conservative approach w/ O2, Pred trial, cough syrup, comfort measures, and local follow up; she is quite conflicted as she does not have a good support system- she describes Husb as "child-like" and  no help to her, 2 sons- one handicapped & one no help at all, one sis is controlling & she doesn't know where to turn, she will consider her options and let me know; in the meanwhile- we will ask [facility]- her Resp/DME for a portable concentrator (2L/min at rest & up to 4L/min w/ exercise); continue Pred20mg /d for now; add Phen Expect w/ codeine for cough; plan ROV 6wks w/ labs. 8/16> Raynah wants to continue on a more conservative treatment path, doesn't want lung biopsy or to consider Perfenidone treatment option;  She understands that her prognosis is poor (guarded at best);  For now she will continue PRED /d, her Home O2, refill cough syrup, and plan ROV in 45mo;  She is quite anxious w/ mult stressors at home & offered Alprazolam 0.5mg  tid for as needed use. 10/16> Annaka reiterates her desire for a conservative/supportive treatment program; we decided to try to taper the Pred to  alternating w/  Qod; she received the 2016 Flu vaccine today; she forgot to go to the basement for her f/u CXR & Labs.... rec ROV in 2-3 months, sooner if needed for worsening symptoms.     Plan:       Patient's Medications  New Prescriptions   No medications on file  Previous Medications   ALBUTEROL (PROVENTIL HFA;VENTOLIN HFA) 108 (90 BASE) MCG/ACT INHALER    Inhale 2 puffs into the lungs every 6 (six) hours as needed for wheezing or shortness of breath.   CHOLECALCIFEROL (VITAMIN D-3) 1000 UNITS CAPS    Take 1 capsule by mouth daily.   CYANOCOBALAMIN (VITAMIN B 12 PO)    Take by mouth daily.   PREDNISONE (DELTASONE) 20 MG TABLET    Take 1 tablet (20 mg total) by mouth daily.   RANITIDINE (ZANTAC) 150 MG TABLET    Take 150 mg by mouth 2 (two) times daily.  Modified Medications   Modified Medication Previous Medication   ACETAMINOPHEN-CODEINE 120-12 MG/5ML SUSPENSION acetaminophen-codeine 120-12 MG/5ML suspension      Take 5 mLs by mouth every 4 (four) hours as needed for pain.    Take 5 mLs by mouth every 4  (four) hours as needed for pain.   ALPRAZOLAM (XANAX) 0.5 MG TABLET ALPRAZolam (XANAX) 0.5 MG tablet      Take 1 tablet by mouth three times daily as directed by physician    Take 1 tablet by mouth three times daily as directed by physician   PROMETHAZINE-CODEINE (PHENERGAN WITH CODEINE) 6.25-10 MG/5ML SYRUP promethazine-codeine (PHENERGAN WITH CODEINE) 6.25-10 MG/5ML syrup      Take 5 mLs by mouth every 6 (six) hours as needed for cough.    Take 5 mLs by mouth every 6 (six) hours as needed for cough.  Discontinued Medications   No medications on file

## 2016-02-07 ENCOUNTER — Encounter: Payer: Self-pay | Admitting: Pulmonary Disease

## 2016-02-07 NOTE — Progress Notes (Signed)
Subjective:     Patient ID: Jane Turner, female   DOB: 1951-09-07, 65 y.o.   MRN: [id number]  HPI ~  Apr 28, 2015:  Initial pulmonary consult by SN>   65 y/o female referred by Dr. Mora Appl in [place], [place] for a pulmonary evaluation>> DrMcLeod saw Jane Turner for the 1st time 03/11/15 w/ hx dry cough & "chronic pneumonia" per her prev physician (Dr. 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 She was placed on an [facility] inhaler & thinks maybe that helped a little but she is off this now; used VENTOLIN-HFA w/ some improvement... DrMcLeod started her on OXYGEN at 2L/min flow based on an O2sat of 84% in his office...       She is an ex-smoker having started at age9 (around 69) and quit in 1990; 30 yrs of smoking mostly 1ppd but up to 2ppd when anxious; est ~40 pack-yr smoking hx...       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      FamHx includes heart dis, arthritis, but no lung diseases in family...      We have called for old records to review...  EXAM reveals Afeb, VSS, O2sat=92% on 2L/min at rest;  HEENT- neg, mallampati3;  Chest- short shallow breaths, fine velcro rales 1/2 way up w/o wheezing or consolidation;   Abd- soft, nontender;  Ext- w/o c/c/e...  CXR 12/31/14 at [facility]> diffuse increase in interstitial markings, normal heart size, no effusion... Note- last prev CXR was 11/2003 & it was reported clear...   CTChest at [facility] 01/09/15> diffuse interstitial infiltrates in both lungs w/ periph & basilar predominance, mild bronchiectasis in RLL, 8mm noncalcif nodule in LUL, no signif adenopathy, smHH, fatty replacement of pancreas, sm cyst right kidney...  Spirometry 03/11/15 in [place]> FVC=1.25 (40%), FEV1=1.15 (48%), %1sec=93, mid-flows were recorded superphysiologic at 142% predicted; the flow-vol loop indicates poor cooperation w/ the testing procedure  Ambulatory oxygen saturation test 04/28/15> O2sat on 2L/min [place] at rest=97% w/ pulse=84/min;  She walked only 1 lap in our office w/ lowest O2sat=86% on her 2L oxygen & pulse was 99/min...  LABS 5/16 w/ collagen-vasc screen> Chems- wnl;  CBC- wnl;  TSH=1.25;  ACE=35;  Sed=46;  RF=neg;  p-ANCA=neg... ANA=neg;    ADDENDUM> Hi-res CTChest> done 05/06/15> +diffuse ILD w/ patchy areas of ground-glass attenuation, subpleural & peribronchovasc reticulation, and thckening of the interstitium; some assoc traction bronchiectasis (esp in LLs) and peripheral bronchiolectasis w/o frank honeycombing; no mediastinal adenopathy, norm heart size, no peric effus, smallHH, fatty atrophy of pancreas; Radiology favors fibrotic phase NSIP but the craniocaudal gradient favors UIP...   ADDENDUM> FullPFTs> sched 06/01/15 but pt unable to perform. IMP/PLAN>> This  is going to be difficult for her to work out, traveling for the FullPFT & back for the Hi-res CTChest; ideally she would need Bronch w/ Lavage and TBBx but this is going to be difficult for her;  She has signif interstitial lung dis but the etiology/ Dx is ???at this point;  We discussed all this quite frankly & I think the best option for her might be to start PREDNSONE ~20mg /d while we attempt to gather more  information, see if she has a steroid responsive pneumonitis/ ILD.  ~  June 01, 2015:  28mo ROV & Kimori has been on the Pred20mg /d for the last month- no real change in her dyspnea, dry cough, "chest tightness"... She is here alone today & w/o her oxygen...     ILD ?etiology, restrictive physiology, bronchiectasis, chronic hypoxemic resp failure> it appears far advanced & likely in the fibrotic phase; she needs further evaluation & lung biopsy, I have suggested referral to [facility] but she is not sure that she wants that aggressive approach & may prefer conservative approach to eval & treatment => port O2 concentrator, oral Pred, cough control, and comfort measures (she will let us know her decision); ECOG performance status=1 at present...       We reviewed prob list, meds, xrays and labs>   CXR today revealed normal heart size, coarse interstitial markings R>L c/w pulm fibrosis, no complicating features...  PFT today was aborted- she was unable to perform the PFT> avail data showed FVC=1.00 (30%), FEV1=0.88 (35%), %1sec=88, and mid-flows reduced at 38% predicted...                       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 and unlikely to be responsive to treatment; We laid out 2 options> A) a more aggressive approach w/ referral to [facility] for additional testing, lung biopsy, and treatment options; or B) a more conservative approach w/ O2, Pred trial, cough syrup, comfort measures, and local follow up; she is quite conflicted as she does not have a good support system- she describes Husb as "child-like" and no help to her, 2 sons- one handicapped & one no help at all, one sis is controlling & she doesn't know where to turn, she will consider her options and let me know; in the meanwhile- we will ask [facility]- her Resp/DME for a portable concentrator (2L/min at rest & up to 4L/min w/  exercise); continue Pred20mg /d for now; add Phen Expect w/ codeine for cough; plan ROV 6wks w/ labs...  ~  August 21, 2015:  2-328mo ROV & Jenissa reports "up & down- everythings about the same"> she's been on Pred20 but ran out several wks ago 7 didn't have the $$ to refill it she says (on [place]); her breathing is about the same w/ good days and bad, recently noted incr cough as well but ran out of cough syrup too...     ILD ?etiology w/ bronchiectasis on Hi-res CT, restrictive physiology, chronic hypoxemic resp failure> We reviewed the previously discussed option re: more aggressive care thru referral to [facility] vs continued more conservative approach as we've been doing w/  Pred trial, oxygen, cough syrup, comfort measures> she clearly favors the latter option, can't handle the transport issues/ logistics of [facility]/ "it's too much for me" and she has poor support system... EXAM reveals Afeb, VSS, O2sat=94% on 2L/min at rest;  HEENT- neg, mallampati3;  Chest- short shallow breaths, fine velcro rales 1/2 way up w/o wheezing or consolidation;  Abd- soft, nontender;  Ext- w/o c/c/e... IMP/PLAN>>  Merritt wants to continue on a more conservative treatment path, doesn't want lung biopsy or to consider Perfenidone treatment option;  She understands that her prognosis is poor (guarded at best);  For now she will continue PRED 20mg /d, her Home O2, refill cough syrup, and plan ROV in 68mo w/ CXR & LABS (consider repeat CTChest as well);  She is quite anxious w/ mult stressors at home & offered Alprazolam 0.5mg  tid for as needed use...  ~  October 21, 2015:  68mo ROV & Dachelle reports that she is generally stable, good days & bad, notes incr DOE w/ activituies/ exertion;  She ventilated about probems w/ her ex-husband, family issues w/ children, but notes that her current husb has been helping more as has the church community;  She remains on Home O2, Pred20/d, Tylenol w/ codeine cough syrup, Albut HFA prn, Zantac150Bid &  Xanax0.5mg Tid (ran out);  She has chosen a conservative, supportive and symptomatic approach to treatment, she has declines more aggressive evaluation- lung bx, consideration of antifibrotic therapy, med center referral for second opinion, etc- and she reiterates her decision for me today...  EXAM shows Afeb, VSS, O2sat=92% on 2L/min at rest;  HEENT- neg, mallampati3;  Chest- short shallow breaths, fine velcro rales 1/2 way up w/o wheezing or consolidation;  Abd- soft, nontender;  Ext- w/o c/c/e...  CXR 10/16> she did not go to the [facility] as requested for f/u CXR today...  LABS 10/16> she did not go to the lab for f/u blood work as requested today... IMP/PLAN>>  ILD ?etiology (NSIP vs UIP- see Hi-res CTChest), w/ restrictive physiology, chronic hypoxemic resp failure; she forgot to go to the basement for f/u CXR & Labs as requested today; we decided to try to taper the Pred to 20mg  alternating w/ 10mg  Qod; she received the 2016 Flu vaccine today.... rec ROV in 2-3 months, sooner if needed for worsening symptoms.  ~  January 21, 2016:  56mo ROV & Oliviah indicates that her breathing is about the same- no better, no worse, and she is able to perform her ADLs satisfactorily on her meds & HomeO2 (from [facility]);  Currently taking Pred20- 1tab alt w/ 1/2tab Qod, AlbutHFA, and cough syrup prn;  She notes cough on & off (good days and bad), mostly dry (min phlegm, no blood), min CP from coughing;  She also has Zantac150Bid and Xanax 0.5mg  Tid prn (refilled)...  EXAM shows Afeb, VSS, O2sat=97% on 2L/min at rest;  HEENT- neg, mallampati3;  Chest- short shallow breaths, fine velcro rales 1/2 way up w/o wheezing or consolidation;  Abd- soft, nontender;  Ext- w/o c/c/e...  CXR 01/21/16> normal heart size, diffuse increase in the interstitial markings (?sl worse?), no edema/ effusions/ etc... IMP/PLAN>>  ILD ?etiology (NSIP vs UIP- see Hi-res CTChest), restrictive physiology, chronic hypoxemic resp failure> Bonita Quin  re-confirms her desire for a conservative treatment path- refusing lung bx, refusing Perfenidone rx, refusing referral for second opinion, and she again declines my offer to discuss her disease w/ family members;  She is asked to remain as active as poss, use her O2  24/7, and continue the Pred 20-10 Qod...     Past Medical History  Diagnosis Date  . Allergic rhinitis    ? Other PMHx >>          Ulcers >. She is taking OTC Ranitadine prn         "they are checking my left breast"         c/o back pain          Anxiety (stress w/ husb)         ?Vit B12 defic >. She is taking an OTC B12 supplement         ?Vit D defic >> she is taking an OTC VitD supplement    Past Surgical History  Procedure Laterality Date  . Tubal ligation      Outpatient Encounter Prescriptions as of 01/21/2016  Medication Sig  . albuterol (PROVENTIL HFA;VENTOLIN HFA) 108 (90 BASE) MCG/ACT inhaler Inhale 2 puffs into the lungs every 6 (six) hours as needed for wheezing or shortness of breath.  . Cholecalciferol (VITAMIN D-3) 1000 units CAPS Take 1 capsule by mouth daily.  . Cyanocobalamin (VITAMIN B 12 PO) Take by mouth daily.  . predniSONE (DELTASONE) 20 MG tablet Take 1 tablet (20 mg total) by mouth daily. (Patient taking differently: Take 20 mg by mouth daily. Alternate between 20mg  and 10mg  every other day)  . ranitidine (ZANTAC) 150 MG tablet Take 150 mg by mouth 2 (two) times daily.  Marland Kitchen acetaminophen-codeine 120-12 MG/5ML suspension Take 5 mLs by mouth every 4 (four) hours as needed for pain. (Patient not taking: Reported on 01/21/2016)  . ALPRAZolam (XANAX) 0.5 MG tablet Take 1 tablet by mouth three times daily as directed by physician  . promethazine-codeine (PHENERGAN WITH CODEINE) 6.25-10 MG/5ML syrup Take 5 mLs by mouth every 6 (six) hours as needed for cough.  . [DISCONTINUED] ALPRAZolam (XANAX) 0.5 MG tablet Take 1 tablet by mouth three times daily as directed by physician (Patient not taking: Reported on  01/21/2016)  . [DISCONTINUED] promethazine-codeine (PHENERGAN WITH CODEINE) 6.25-10 MG/5ML syrup Take 5 mLs by mouth every 6 (six) hours as needed for cough. (Patient not taking: Reported on 08/21/2015)   No facility-administered encounter medications on file as of 01/21/2016.    Allergies  Allergen Reactions  . Aspirin Nausea And Vomiting  . Bee Venom Swelling  . Aleve [Naproxen Sodium] Palpitations  . Azithromycin Rash  . Itraconazole Rash  . Levaquin [Levofloxacin In D5w] Rash    Current Medications, Allergies, Past Medical History, Past Surgical History, Family History, and Social History were reviewed in [employer] record.   Review of Systems         All symptoms NEG except where BOLDED >>  Constitutional:  Denies F/C/S, anorexia, unexpected weight change. HEENT:  No HA, visual changes, earache, nasal symptoms, sore throat, hoarseness. Resp:  cough, sputum, hemoptysis; SOB, tightness, wheezing. Cardio:  CP, palpit, DOE, orthopnea, edema. GI:  Denies N/V/D/C or blood in stool; reflux, abd pain, distention, or gas. GU:  No dysuria, freq, urgency, hematuria, or flank pain. MS:  joint pain, swelling, tenderness, neck pain, back pain, etc. Neuro:  Headaches, tremors, seizures, dizziness, syncope, weakness, numbness, gait abn. Skin:  No suspicious lesions or skin rash. Heme:  No adenopathy, bruising, bleeding. Psyche: Denies confusion, sleep disturbance, hallucinations, anxiety, depression.   Objective:   Physical Exam    Vital Signs:  Reviewed... on O2 at 2L/min [place]...  General:  WD, WN, 65 y/o WF  in NAD, chr ill appearing; alert & oriented; pleasant & cooperative; talking in full sentences... HEENT:  [place]/AT; Conjunctiva- pink, Sclera- nonicteric, EOM-wnl, PERRLA, EACs-clear, TMs-wnl; NOSE-clear; THROAT-clear & wnl. Neck:  Supple w/ fair ROM; no JVD; normal carotid impulses w/o bruits; no thyromegaly or nodules palpated; no lymphadenopathy. Chest:   fine  velcro rales 1/2 way up, no wheezing or consolidation heard... Heart:  Regular Rhythm; norm S1 & S2 without murmurs, rubs, or gallops detected. Abdomen:  Soft & nontender- no guarding or rebound; normal bowel sounds; no organomegaly or masses palpated. Ext:  mild arthritic changes; no varicose veins, +venous insuffic, tr edema;  Pulses intact w/o bruits. Neuro:  No focal neuro deficits, gait normal & balance OK. Derm:  No lesions noted; no rash etc. Lymph:  No cervical, supraclavicular, axillary, or inguinal adenopathy palpated.   Assessment:      IMP >>     Diffuse Interstitial Lung Disease ?etiology=> NSIP vs UIP via Hi-res CTChest 04/2015...    Chronic hypoxemic resp failure     Restrictive lung physiology    Ex-smoker     Anxiety  PLAN >>  6/16> We spent quite a bit of time discussing her dilemma- severe ILD, likely a fibrotic phase and unlikely to be responsive to treatment; We laid out 2 options> A) a more aggressive approach w/ referral to [facility] for additional testing, lung biopsy, and treatment options; or B) a more conservative approach w/ O2, Pred trial, cough syrup, comfort measures, and local follow up; she is quite conflicted as she does not have a good support system- she describes Husb as "child-like" and no help to her, 2 sons- one handicapped & one no help at all, one sis is controlling & she doesn't know where to turn, she will consider her options and let me know; in the meanwhile- we will ask [facility]- her Resp/DME for a portable concentrator (2L/min at rest & up to 4L/min w/ exercise); continue Pred20mg /d for now; add Phen Expect w/ codeine for cough; plan ROV 6wks w/ labs. 8/16> Murdis wants to continue on a more conservative treatment path, doesn't want lung biopsy or to consider Perfenidone treatment option;  She understands that her prognosis is poor (guarded at best);  For now she will continue PRED 20mg /d, her Home O2, refill cough syrup, and plan ROV in 14mo;  She is quite  anxious w/ mult stressors at home & offered Alprazolam 0.5mg  tid for as needed use. 10/16> A[place] reiterates her desire for a conservative/supportive treatment program; we decided to try to taper the Pred to 20mg  alternating w/ 10mg  Qod; she received the 2016 Flu vaccine today. 01/21/16>  Morrie Sheldon her desire for a conservative treatment path- refusing lung bx, refusing Perfenidone rx, refusing referral for second opinion, and she again declines my offer to discuss her disease w/ family members;  She is asked to remain as active as poss, use her O2 24/7, and continue the Pred 20-10 Qod     Plan:       Patient's Medications  New Prescriptions   No medications on file  Previous Medications   ACETAMINOPHEN-CODEINE 120-12 MG/5ML SUSPENSION    Take 5 mLs by mouth every 4 (four) hours as needed for pain.   ALBUTEROL (PROVENTIL HFA;VENTOLIN HFA) 108 (90 BASE) MCG/ACT INHALER    Inhale 2 puffs into the lungs every 6 (six) hours as needed for wheezing or shortness of breath.   CHOLECALCIFEROL (VITAMIN D-3) 1000 UNITS CAPS    Take 1 capsule  by mouth daily.   CYANOCOBALAMIN (VITAMIN B 12 PO)    Take by mouth daily.   PREDNISONE (DELTASONE) 20 MG TABLET    Take 1 tablet (20 mg total) by mouth daily.   RANITIDINE (ZANTAC) 150 MG TABLET    Take 150 mg by mouth 2 (two) times daily.  Modified Medications   Modified Medication Previous Medication   ALPRAZOLAM (XANAX) 0.5 MG TABLET ALPRAZolam (XANAX) 0.5 MG tablet      Take 1 tablet by mouth three times daily as directed by physician    Take 1 tablet by mouth three times daily as directed by physician   PROMETHAZINE-CODEINE (PHENERGAN WITH CODEINE) 6.25-10 MG/5ML SYRUP promethazine-codeine (PHENERGAN WITH CODEINE) 6.25-10 MG/5ML syrup      Take 5 mLs by mouth every 6 (six) hours as needed for cough.    Take 5 mLs by mouth every 6 (six) hours as needed for cough.  Discontinued Medications   No medications on file

## 2016-04-21 ENCOUNTER — Ambulatory Visit (INDEPENDENT_AMBULATORY_CARE_PROVIDER_SITE_OTHER): Payer: Medicaid Other | Admitting: Pulmonary Disease

## 2016-04-21 ENCOUNTER — Encounter: Payer: Self-pay | Admitting: Pulmonary Disease

## 2016-04-21 VITALS — BP 100/70 | HR 109 | Temp 98.0°F | Resp 20 | Ht 63.0 in

## 2016-04-21 DIAGNOSIS — R0600 Dyspnea, unspecified: Secondary | ICD-10-CM

## 2016-04-21 DIAGNOSIS — F411 Generalized anxiety disorder: Secondary | ICD-10-CM | POA: Diagnosis not present

## 2016-04-21 DIAGNOSIS — J8410 Pulmonary fibrosis, unspecified: Secondary | ICD-10-CM | POA: Diagnosis not present

## 2016-04-21 DIAGNOSIS — J9611 Chronic respiratory failure with hypoxia: Secondary | ICD-10-CM

## 2016-04-21 MED ORDER — ALPRAZOLAM 0.5 MG PO TABS: 0.5000 mg | ORAL_TABLET | Freq: Three times a day (TID) | ORAL | Status: AC | PRN

## 2016-04-21 MED ORDER — PREDNISONE 20 MG PO TABS: 20.0000 mg | ORAL_TABLET | Freq: Every day | ORAL | Status: AC

## 2016-04-21 NOTE — Patient Instructions (Signed)
Today we updated your med list in our EPIC system...    Continue your current medications the same...  We wrote a new prescription for your PREDNISONE 20mg  tabs- take one tab each AM...  We refilled your XANAX (Alprazolam) 0.5mg  tabs- one tab up to 3 times daily for nerves...  I [facility] for help w/ your medical condition and symptom management (shortness of breath & cough)..  Call for any questions or if we can be of service in any way...  Let's plan a follow up visit in 3months.Marland Kitchen..Marland Kitchen

## 2016-04-21 NOTE — Progress Notes (Signed)
Subjective:     Patient ID: Jane Turner, female   DOB: 1951-09-07, 65 y.o.   MRN: [id number]  HPI ~  Apr 28, 2015:  Initial pulmonary consult by SN>   65 y/o female referred by Dr. Mora Appl in [place], [place] for a pulmonary evaluation>> DrMcLeod saw Jane Turner for the 1st time 03/11/15 w/ hx dry cough & "chronic pneumonia" per her prev physician (Dr. 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 She was placed on an [facility] inhaler & thinks maybe that helped a little but she is off this now; used VENTOLIN-HFA w/ some improvement... DrMcLeod started her on OXYGEN at 2L/min flow based on an O2sat of 84% in his office...       She is an ex-smoker having started at age9 (around 69) and quit in 1990; 30 yrs of smoking mostly 1ppd but up to 2ppd when anxious; est ~40 pack-yr smoking hx...       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      FamHx includes heart dis, arthritis, but no lung diseases in family...      We have called for old records to review...  EXAM reveals Afeb, VSS, O2sat=92% on 2L/min at rest;  HEENT- neg, mallampati3;  Chest- short shallow breaths, fine velcro rales 1/2 way up w/o wheezing or consolidation;   Abd- soft, nontender;  Ext- w/o c/c/e...  CXR 12/31/14 at [facility]> diffuse increase in interstitial markings, normal heart size, no effusion... Note- last prev CXR was 11/2003 & it was reported clear...   CTChest at [facility] 01/09/15> diffuse interstitial infiltrates in both lungs w/ periph & basilar predominance, mild bronchiectasis in RLL, 8mm noncalcif nodule in LUL, no signif adenopathy, smHH, fatty replacement of pancreas, sm cyst right kidney...  Spirometry 03/11/15 in [place]> FVC=1.25 (40%), FEV1=1.15 (48%), %1sec=93, mid-flows were recorded superphysiologic at 142% predicted; the flow-vol loop indicates poor cooperation w/ the testing procedure  Ambulatory oxygen saturation test 04/28/15> O2sat on 2L/min [place] at rest=97% w/ pulse=84/min;  She walked only 1 lap in our office w/ lowest O2sat=86% on her 2L oxygen & pulse was 99/min...  LABS 5/16 w/ collagen-vasc screen> Chems- wnl;  CBC- wnl;  TSH=1.25;  ACE=35;  Sed=46;  RF=neg;  p-ANCA=neg... ANA=neg;    ADDENDUM> Hi-res CTChest> done 05/06/15> +diffuse ILD w/ patchy areas of ground-glass attenuation, subpleural & peribronchovasc reticulation, and thckening of the interstitium; some assoc traction bronchiectasis (esp in LLs) and peripheral bronchiolectasis w/o frank honeycombing; no mediastinal adenopathy, norm heart size, no peric effus, smallHH, fatty atrophy of pancreas; Radiology favors fibrotic phase NSIP but the craniocaudal gradient favors UIP...   ADDENDUM> FullPFTs> sched 06/01/15 but pt unable to perform. IMP/PLAN>> This  is going to be difficult for her to work out, traveling for the FullPFT & back for the Hi-res CTChest; ideally she would need Bronch w/ Lavage and TBBx but this is going to be difficult for her;  She has signif interstitial lung dis but the etiology/ Dx is ???at this point;  We discussed all this quite frankly & I think the best option for her might be to start PREDNSONE ~20mg /d while we attempt to gather more  information, see if she has a steroid responsive pneumonitis/ ILD.  ~  June 01, 2015:  28mo ROV & Jane Turner has been on the Pred20mg /d for the last month- no real change in her dyspnea, dry cough, "chest tightness"... She is here alone today & w/o her oxygen...     ILD ?etiology, restrictive physiology, bronchiectasis, chronic hypoxemic resp failure> it appears far advanced & likely in the fibrotic phase; she needs further evaluation & lung biopsy, I have suggested referral to [facility] but she is not sure that she wants that aggressive approach & may prefer conservative approach to eval & treatment => port O2 concentrator, oral Pred, cough control, and comfort measures (she will let us know her decision); ECOG performance status=1 at present...       We reviewed prob list, meds, xrays and labs>   CXR today revealed normal heart size, coarse interstitial markings R>L c/w pulm fibrosis, no complicating features...  PFT today was aborted- she was unable to perform the PFT> avail data showed FVC=1.00 (30%), FEV1=0.88 (35%), %1sec=88, and mid-flows reduced at 38% predicted...                       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 and unlikely to be responsive to treatment; We laid out 2 options> A) a more aggressive approach w/ referral to [facility] for additional testing, lung biopsy, and treatment options; or B) a more conservative approach w/ O2, Pred trial, cough syrup, comfort measures, and local follow up; she is quite conflicted as she does not have a good support system- she describes Husb as "child-like" and no help to her, 2 sons- one handicapped & one no help at all, one sis is controlling & she doesn't know where to turn, she will consider her options and let me know; in the meanwhile- we will ask [facility]- her Resp/DME for a portable concentrator (2L/min at rest & up to 4L/min w/  exercise); continue Pred20mg /d for now; add Phen Expect w/ codeine for cough; plan ROV 6wks w/ labs...  ~  August 21, 2015:  2-328mo ROV & Jane Turner reports "up & down- everythings about the same"> she's been on Pred20 but ran out several wks ago 7 didn't have the $$ to refill it she says (on [place]); her breathing is about the same w/ good days and bad, recently noted incr cough as well but ran out of cough syrup too...     ILD ?etiology w/ bronchiectasis on Hi-res CT, restrictive physiology, chronic hypoxemic resp failure> We reviewed the previously discussed option re: more aggressive care thru referral to [facility] vs continued more conservative approach as we've been doing w/  Pred trial, oxygen, cough syrup, comfort measures> she clearly favors the latter option, can't handle the transport issues/ logistics of [facility]/ "it's too much for me" and she has poor support system... EXAM reveals Afeb, VSS, O2sat=94% on 2L/min at rest;  HEENT- neg, mallampati3;  Chest- short shallow breaths, fine velcro rales 1/2 way up w/o wheezing or consolidation;  Abd- soft, nontender;  Ext- w/o c/c/e... IMP/PLAN>>  Jane Turner wants to continue on a more conservative treatment path, doesn't want lung biopsy or to consider Perfenidone treatment option;  She understands that her prognosis is poor (guarded at best);  For now she will continue PRED 20mg /d, her Home O2, refill cough syrup, and plan ROV in 68mo w/ CXR & LABS (consider repeat CTChest as well);  She is quite anxious w/ mult stressors at home & offered Alprazolam 0.5mg  tid for as needed use...  ~  October 21, 2015:  68mo ROV & Jane Turner reports that she is generally stable, good days & bad, notes incr DOE w/ activituies/ exertion;  She ventilated about probems w/ her ex-husband, family issues w/ children, but notes that her current husb has been helping more as has the church community;  She remains on Home O2, Pred20/d, Tylenol w/ codeine cough syrup, Albut HFA prn, Zantac150Bid &  Xanax0.5mg Tid (ran out);  She has chosen a conservative, supportive and symptomatic approach to treatment, she has declines more aggressive evaluation- lung bx, consideration of antifibrotic therapy, med center referral for second opinion, etc- and she reiterates her decision for me today...  EXAM shows Afeb, VSS, O2sat=92% on 2L/min at rest;  HEENT- neg, mallampati3;  Chest- short shallow breaths, fine velcro rales 1/2 way up w/o wheezing or consolidation;  Abd- soft, nontender;  Ext- w/o c/c/e...  CXR 10/16> she did not go to the [facility] as requested for f/u CXR today...  LABS 10/16> she did not go to the lab for f/u blood work as requested today... IMP/PLAN>>  ILD ?etiology (NSIP vs UIP- see Hi-res CTChest), w/ restrictive physiology, chronic hypoxemic resp failure; she forgot to go to the basement for f/u CXR & Labs as requested today; we decided to try to taper the Pred to 20mg  alternating w/ 10mg  Qod; she received the 2016 Flu vaccine today.... rec ROV in 2-3 months, sooner if needed for worsening symptoms.  ~  January 21, 2016:  56mo ROV & Jane Turner indicates that her breathing is about the same- no better, no worse, and she is able to perform her ADLs satisfactorily on her meds & HomeO2 (from [facility]);  Currently taking Pred20- 1tab alt w/ 1/2tab Qod, AlbutHFA, and cough syrup prn;  She notes cough on & off (good days and bad), mostly dry (min phlegm, no blood), min CP from coughing;  She also has Zantac150Bid and Xanax 0.5mg  Tid prn (refilled)...  EXAM shows Afeb, VSS, O2sat=97% on 2L/min at rest;  HEENT- neg, mallampati3;  Chest- short shallow breaths, fine velcro rales 1/2 way up w/o wheezing or consolidation;  Abd- soft, nontender;  Ext- w/o c/c/e...  CXR 01/21/16> normal heart size, diffuse increase in the interstitial markings (?sl worse?), no edema/ effusions/ etc... IMP/PLAN>>  ILD ?etiology (NSIP vs UIP- see Hi-res CTChest), restrictive physiology, chronic hypoxemic resp failure> Jane Turner  re-confirms her desire for a conservative treatment path- refusing lung bx, refusing Perfenidone rx, refusing referral for second opinion, and she again declines my offer to discuss her disease w/ family members;  She is asked to remain as active as poss, use her O2  24/7, and continue the Pred 20-10 Qod...   ~  April 21, 2016:  17mo ROV & Jane Turner is here w/ her husb for the 1st time; she ran out of her meds- Pred20 & Xanax over a month ago, and she is worse- more SOB, dry cough, very uncomfortable breathing; I pointed out that these meds were very inexpensive & refillable or they could call us at any time but they are clueless and not competent to handle her meds/ her medical problems/ life in general; they want to know what we can do to help them further 7 it is clear that she is ready for Hospice- comfort care, symptom management, DNR/NCB...     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 unable to refill? I am not sure but they need help at home to manage her symptoms and her condition;  We reviewed HOSPICE care and they are in favor- we will place the call to set this up...  EXAM reveals Afeb, BP sl low at 92/70, O2sat=79% on RA=> 93% on 3L/min;  HEENT- neg, mallampati3;  Chest- short shallow breaths, fine velcro rales 1/2 way up w/o wheezing or consolidation;  Abd- soft, nontender;  Ext- w/o c/c/e... IMP/PLAN>>  Jane Turner has end-stage dis & is now ready for Hospice home care- full comfort measures, symptomatic support, NCB/DNR... We refilled her Pred20mg /d and Alprazolam 0.5mg  tid, and we will work w/ the Hospice team to provide what ever she needs for comfort measures and symptom control...     Past Medical History  Diagnosis Date  .  Allergic rhinitis    ? Other PMHx >>          Ulcers >. She is taking OTC Ranitadine prn         "they are checking my left breast"         c/o back pain          Anxiety (stress w/ husb)         ?Vit B12 defic >. She is taking an OTC B12 supplement         ?Vit D defic >> she is taking an OTC VitD supplement    Past Surgical History  Procedure Laterality Date  . Tubal ligation      Outpatient Encounter Prescriptions as of 04/21/2016  Medication Sig  . acetaminophen-codeine 120-12 MG/5ML suspension Take 5 mLs by mouth every 4 (four) hours as needed for pain.  Marland Kitchen albuterol (PROVENTIL HFA;VENTOLIN HFA) 108 (90 BASE) MCG/ACT inhaler Inhale 2 puffs into the lungs every 6 (six) hours as needed for wheezing or shortness of breath.  . Cholecalciferol (VITAMIN D-3) 1000 units CAPS Take 1 capsule by mouth daily.  . Cyanocobalamin (VITAMIN B 12 PO) Take by mouth daily.  . ranitidine (ZANTAC) 150 MG tablet Take 150 mg by mouth 2 (two) times daily.  Marland Kitchen ALPRAZolam (XANAX) 0.5 MG tablet Take 1 tablet (0.5 mg total) by mouth 3 (three) times daily as needed for anxiety.  . predniSONE (DELTASONE) 20 MG tablet Take 1 tablet (20 mg total) by mouth daily.  . [DISCONTINUED] ALPRAZolam (XANAX) 0.5 MG tablet Take 1 tablet by mouth three times daily as directed by physician (Patient not taking: Reported on 04/21/2016)  . [DISCONTINUED] predniSONE (DELTASONE) 20 MG tablet  Take 1 tablet (20 mg total) by mouth daily. (Patient not taking: Reported on 04/21/2016)  . [DISCONTINUED] promethazine-codeine (PHENERGAN WITH CODEINE) 6.25-10 MG/5ML syrup Take 5 mLs by mouth every 6 (six) hours as needed for cough.   No facility-administered encounter medications on file as of 04/21/2016.    Allergies  Allergen Reactions  . Aspirin Nausea And Vomiting  . Bee Venom Swelling  . Aleve [Naproxen Sodium] Palpitations  . Azithromycin Rash  . Itraconazole Rash  . Levaquin [Levofloxacin In D5w] Rash    Current Medications,  Allergies, Past Medical History, Past Surgical History, Family History, and Social History were reviewed in [facility] electronic medical record.   Review of Systems         All symptoms NEG except where BOLDED >>  Constitutional:  Denies F/C/S, anorexia, unexpected weight change. HEENT:  No HA, visual changes, earache, nasal symptoms, sore throat, hoarseness. Resp:  cough, sputum, hemoptysis; SOB, tightness, wheezing. Cardio:  CP, palpit, DOE, orthopnea, edema. GI:  Denies N/V/D/C or blood in stool; reflux, abd pain, distention, or gas. GU:  No dysuria, freq, urgency, hematuria, or flank pain. MS:  joint pain, swelling, tenderness, neck pain, back pain, etc. Neuro:  Headaches, tremors, seizures, dizziness, syncope, weakness, numbness, gait abn. Skin:  No suspicious lesions or skin rash. Heme:  No adenopathy, bruising, bleeding. Psyche: Denies confusion, sleep disturbance, hallucinations, anxiety, depression.   Objective:   Physical Exam    Vital Signs:  Reviewed... on O2 at 2L/min [place]...  General:  WD, WN, 65 y/o WF in NAD, chr ill appearing; alert & oriented; pleasant & cooperative; talking in full sentences... HEENT:  [place]/AT; Conjunctiva- pink, Sclera- nonicteric, EOM-wnl, PERRLA, EACs-clear, TMs-wnl; NOSE-clear; THROAT-clear & wnl. Neck:  Supple w/ fair ROM; no JVD; normal carotid impulses w/o bruits; no thyromegaly or nodules palpated; no lymphadenopathy. Chest:   fine velcro rales 1/2 way up, no wheezing or consolidation heard... Heart:  Regular Rhythm; norm S1 & S2 without murmurs, rubs, or gallops detected. Abdomen:  Soft & nontender- no guarding or rebound; normal bowel sounds; no organomegaly or masses palpated. Ext:  mild arthritic changes; no varicose veins, +venous insuffic, tr edema;  Pulses intact w/o bruits. Neuro:  No focal neuro deficits, gait normal & balance OK. Derm:  No lesions noted; no rash etc. Lymph:  No cervical, supraclavicular, axillary, or inguinal  adenopathy palpated.   Assessment:      IMP >>     Diffuse Interstitial Lung Disease/ IPF => NSIP vs UIP via Hi-res CTChest 04/2015...    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 and unlikely to be responsive to treatment; We laid out 2 options> A) a more aggressive approach w/ referral to [facility] for additional testing, lung biopsy, and treatment options; or B) a more conservative approach w/ O2, Pred trial, cough syrup, comfort measures, and local follow up; she is quite conflicted as she does not have a good support system- she describes Husb as "child-like" and no help to her, 2 sons- one handicapped & one no help at all, one sis is controlling & she doesn't know where to turn, she will consider her options and let me know; in the meanwhile- we will ask [facility]- her Resp/DME for a portable concentrator (2L/min at rest & up to 4L/min w/ exercise);  continue Pred20mg /d for now; add Phen Expect w/ codeine for cough; plan ROV 6wks w/ labs. 8/16> Sharolyn wants to continue on a more conservative treatment path, doesn't want lung biopsy or to consider Perfenidone treatment option;  She understands that her prognosis is poor (guarded at best);  For now she will continue PRED /d, her Home O2, refill cough syrup, and plan ROV in 105mo;  She is quite anxious w/ mult stressors at home & offered Alprazolam 0.5mg  tid for as needed use. 10/16> Cybil reiterates her desire for a conservative/supportive treatment program; we decided to try to taper the Pred to  alternating w/  Qod; she received the 2016 Flu vaccine today. 01/21/16>  Morrie Sheldon her desire for a conservative treatment path- refusing lung bx, refusing Perfenidone rx, refusing referral for second opinion, and she again declines my offer to discuss her disease w/ family members;  She is asked  to remain as active as poss, use her O2 24/7, and continue the Pred 20-10 Qod 4/27>   Tulani ran out of her meds several weeks ago; she is worse w/ incr SOB & cough, much difficulty breathing; we discussed calling in [facility] home care for comfort measures and symptom management     Plan:       Patient's Medications  New Prescriptions   No medications on file  Previous Medications   ACETAMINOPHEN-CODEINE 120-12 MG/5ML SUSPENSION    Take 5 mLs by mouth every 4 (four) hours as needed for pain.   ALBUTEROL (PROVENTIL HFA;VENTOLIN HFA) 108 (90 BASE) MCG/ACT INHALER    Inhale 2 puffs into the lungs every 6 (six) hours as needed for wheezing or shortness of breath.   CHOLECALCIFEROL (VITAMIN D-3) 1000 UNITS CAPS    Take 1 capsule by mouth daily.   CYANOCOBALAMIN (VITAMIN B 12 PO)    Take by mouth daily.   RANITIDINE (ZANTAC) 150 MG TABLET    Take 150 mg by mouth 2 (two) times daily.  Modified Medications   Modified Medication Previous Medication   ALPRAZOLAM (XANAX) 0.5 MG TABLET ALPRAZolam (XANAX) 0.5 MG tablet      Take 1 tablet (0.5 mg total) by mouth 3 (three) times daily as needed for anxiety.    Take 1 tablet by mouth three times daily as directed by physician   PREDNISONE (DELTASONE) 20 MG TABLET predniSONE (DELTASONE) 20 MG tablet      Take 1 tablet (20 mg total) by mouth daily.    Take 1 tablet (20 mg total) by mouth daily.  Discontinued Medications   PROMETHAZINE-CODEINE (PHENERGAN WITH CODEINE) 6.25-10 MG/5ML SYRUP    Take 5 mLs by mouth every 6 (six) hours as needed for cough.

## 2016-04-25 ENCOUNTER — Telehealth: Payer: Self-pay | Admitting: Pulmonary Disease

## 2016-04-25 NOTE — Telephone Encounter (Signed)
Called, spoke with Olegario MessierKathy with [facility]: 1.  Received hospice referral for end stage pulmonary fibrosis.  Needs to verify SN feels pt has 6 months or less. SN, please advise. Thank you. 2.  Would like to know if SN would like to be attending with hospice MDs assisting with symptom management. Or, would SN prefer hospice MDs take over.   Olegario MessierKathy aware SN out of the office until tomorrow and is ok with call back tomorrow.  SN, please advise.  Thank you.

## 2016-04-26 NOTE — Telephone Encounter (Signed)
Called Hospice.  Spoke with Olegario MessierKathy regarding below per SN.  Olegario MessierKathy verbalized understanding and voiced no further questions or concerns at this time.

## 2016-04-26 NOTE — Telephone Encounter (Signed)
Per SN: 1. Yes, pt has 6 months or less.  2. Yes, SN will be attending with Hospice MD's help with symptom management. Thanks.

## 2016-07-21 ENCOUNTER — Ambulatory Visit: Payer: Medicaid Other | Admitting: Pulmonary Disease

## 2016-09-14 ENCOUNTER — Telehealth: Payer: Self-pay | Admitting: Pulmonary Disease

## 2016-09-14 NOTE — Telephone Encounter (Signed)
Jane Turner with Hospice is in patient's home doing evaluation.  Pt was found to be very dyspneic and anxious.  Patient's heart rate is 114 and respirations at 60 Pt was given Roxanol 5mg  (not on patients med list) and Xanax .5mg  about 30 mins ago.  Pt was also given her Albuterol inhaler and does not seem to coming around very quickly - seems to be helping some but not enough to say that the patient is back at her baseline.  Victorino DikeJennifer is requesting a call back as soon as possible with rec's. Asking to maybe increase her Xanax if SN feels this will help.   Please advise Dr Kriste BasqueNadel. Thanks.     Medication List       Accurate as of 09/14/16 10:00 AM. Always use your most recent med list.          acetaminophen-codeine 120-12 MG/5ML suspension Take 5 mLs by mouth every 4 (four) hours as needed for pain.   albuterol 108 (90 Base) MCG/ACT inhaler Commonly known as:  PROVENTIL HFA;VENTOLIN HFA Inhale 2 puffs into the lungs every 6 (six) hours as needed for wheezing or shortness of breath.   ALPRAZolam 0.5 MG tablet Commonly known as:  XANAX Take 1 tablet (0.5 mg total) by mouth 3 (three) times daily as needed for anxiety.   predniSONE 20 MG tablet Commonly known as:  DELTASONE Take 1 tablet (20 mg total) by mouth daily.   ranitidine 150 MG tablet Commonly known as:  ZANTAC Take 150 mg by mouth 2 (two) times daily.   VITAMIN B 12 PO Take by mouth daily.   Vitamin D-3 1000 units Caps Take 1 capsule by mouth daily.      Allergies  Allergen Reactions  . Aspirin Nausea And Vomiting  . Bee Venom Swelling  . Aleve [Naproxen Sodium] Palpitations  . Azithromycin Rash  . Itraconazole Rash  . Levaquin [Levofloxacin In D5w] Rash

## 2016-09-14 NOTE — Telephone Encounter (Signed)
Per verbal order from SN  Okay to increase xanax and roxanol until pt is comfortable. Goal is to keep pt comfortable at this time.  If pt's family is not comfortable with her being at home pt will need to go to the hospital for further care. Utimately pt and family decision.  LVM for Jane DikeJennifer to return call.

## 2016-09-15 NOTE — Telephone Encounter (Signed)
Sp[facility] with [facility], aware of [facility] rec[facility].  Victorino DikeJennifer states that [facility].   Nothing further needed.

## 2016-09-16 ENCOUNTER — Telehealth: Payer: Self-pay | Admitting: Pulmonary Disease

## 2016-09-22 NOTE — Telephone Encounter (Signed)
LVM for Jane Turner to return call.

## 2016-09-22 NOTE — Telephone Encounter (Signed)
Misty returned call. She is calling to make SN aware that patient passed on 07-May-2016 at hospice at 5:05am and diagnosis was pulmonary fibrosis. I explained to her that I would send a message to SN to make him aware. She voiced understanding and had no further questions.   Will forward to SN

## 2016-09-25 DEATH — deceased

## 2016-10-04 IMAGING — DX DG CHEST 2V
2 series · 2 of 2 positions shown · non-contrast
Comparison: PA and lateral chest x-ray June 01, 2015

CLINICAL DATA: Progressive shortness of breath over the past year;
history of pulmonary fibrosis, former smoker.

EXAM:
CHEST  2 VIEW

[chest pa]
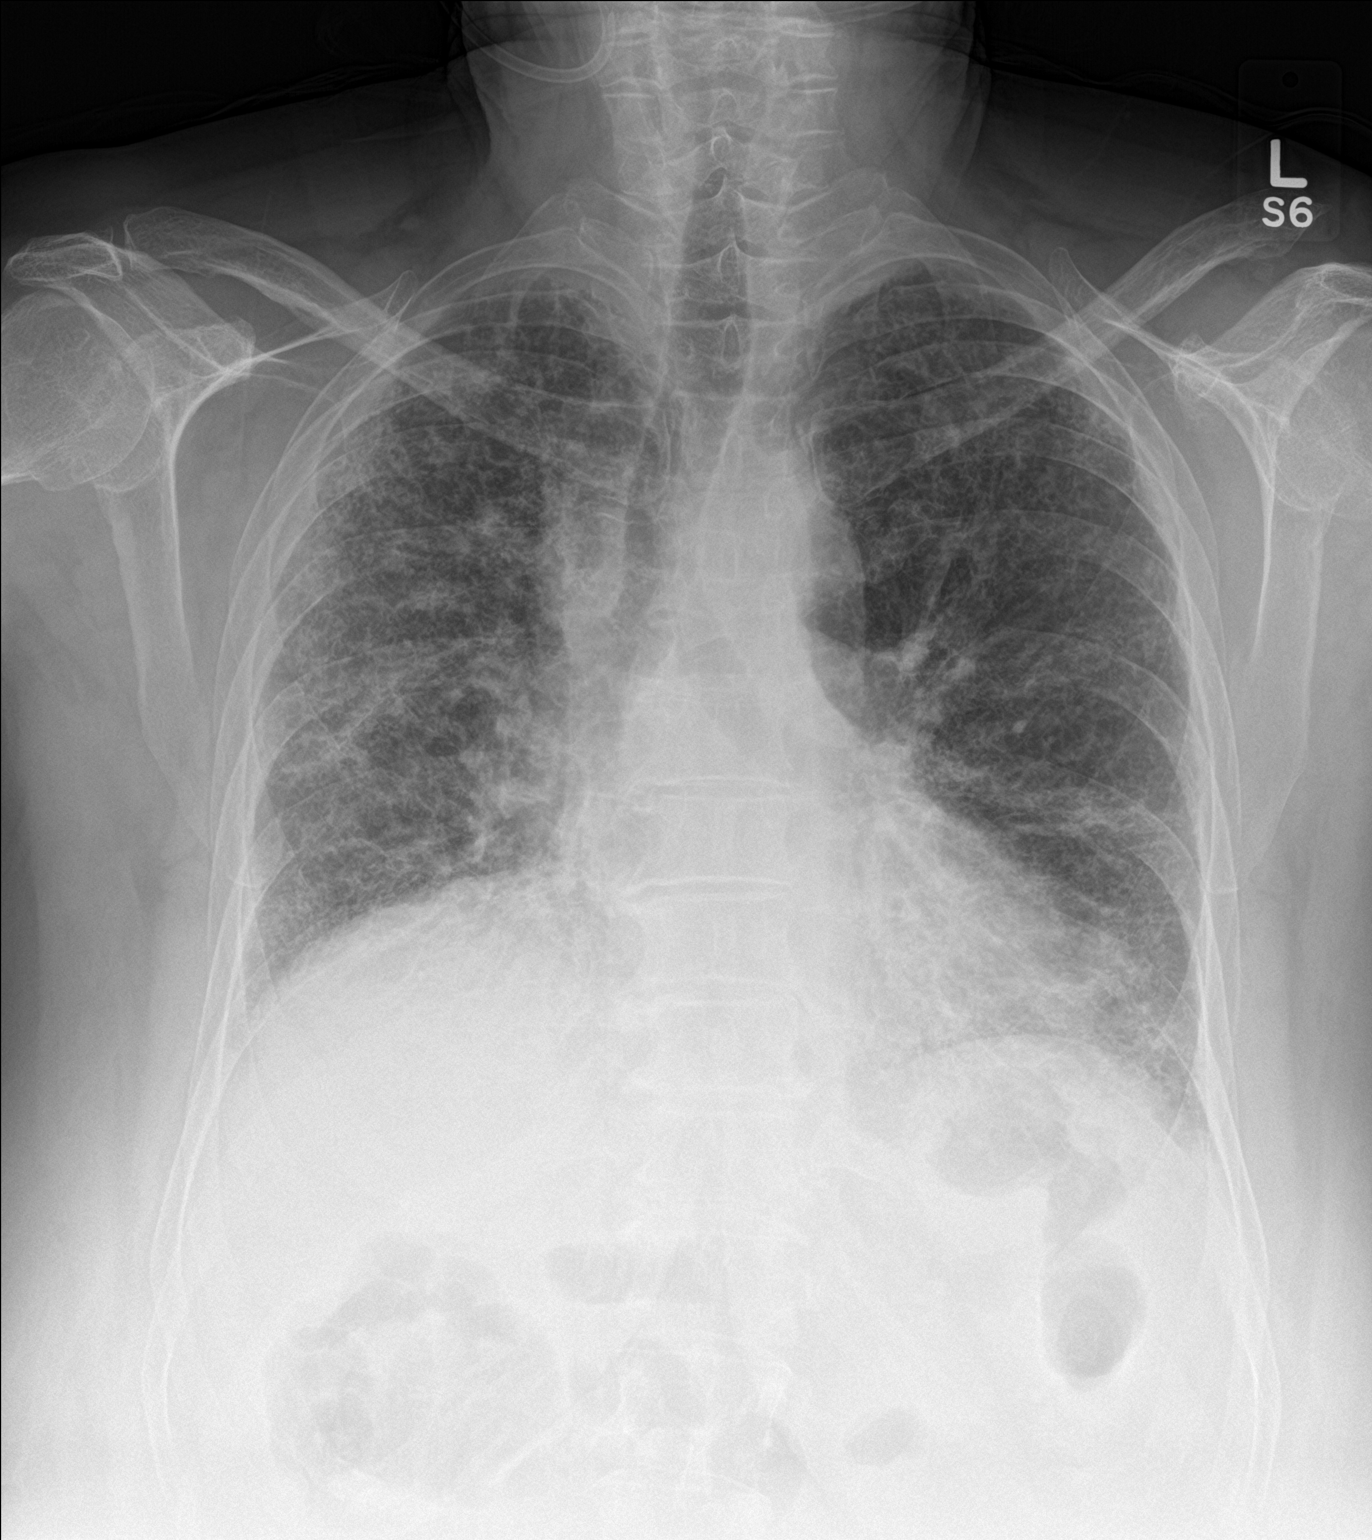

[chest lat]
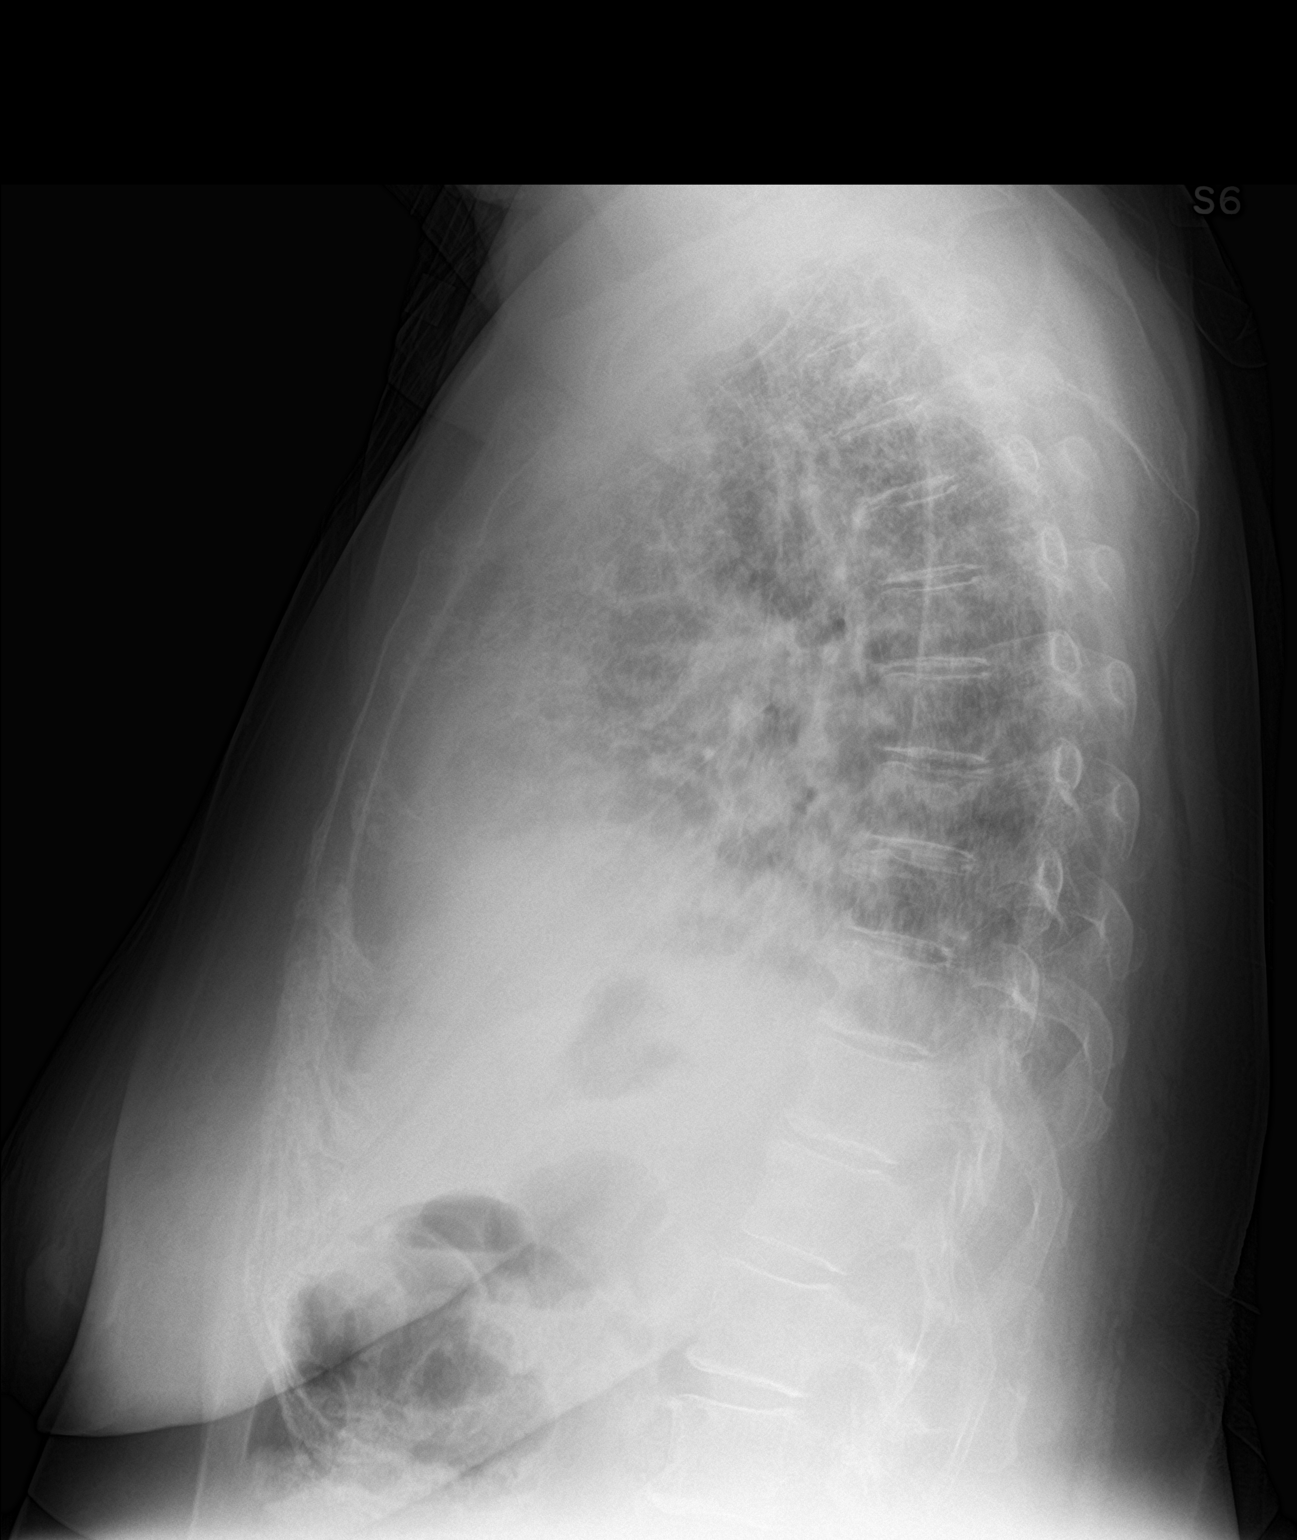

[2 of 2 positions shown; findings below may reference images not displayed]

FINDINGS: There has been overall increase in the conspicuity of the pulmonary
interstitial markings. There is no alveolar infiltrate or pleural
effusion. The cardiac silhouette is normal in size. The mediastinum
is normal in width. The bony thorax exhibits no acute abnormality.
IMPRESSION: Progressive increase in conspicuity of the pulmonary interstitium
likely reflecting progressive pulmonary fibrosis. No objective
evidence of acute pneumonia nor pulmonary edema.

If the patient's clinical status warrants further imaging,
high-resolution chest CT scanning would be useful.
